# Patient Record
Sex: Female | Born: 1960
Health system: Southern US, Community
[De-identification: ages and names within clinical notes are randomized; demographics above are authoritative.]

## PROBLEM LIST (undated history)

## (undated) DIAGNOSIS — I10 Essential (primary) hypertension: Secondary | ICD-10-CM

## (undated) HISTORY — PX: TUBAL LIGATION: SHX77

## (undated) HISTORY — DX: Essential (primary) hypertension: I10

---

## 1999-03-09 ENCOUNTER — Other Ambulatory Visit: Admission: RE | Admit: 1999-03-09 | Discharge: 1999-03-09 | Payer: Self-pay | Admitting: Obstetrics and Gynecology

## 2000-07-10 ENCOUNTER — Other Ambulatory Visit: Admission: RE | Admit: 2000-07-10 | Discharge: 2000-07-10 | Payer: Self-pay | Admitting: Obstetrics and Gynecology

## 2001-08-05 ENCOUNTER — Other Ambulatory Visit: Admission: RE | Admit: 2001-08-05 | Discharge: 2001-08-05 | Payer: Self-pay | Admitting: Obstetrics and Gynecology

## 2003-02-28 ENCOUNTER — Other Ambulatory Visit: Admission: RE | Admit: 2003-02-28 | Discharge: 2003-02-28 | Payer: Self-pay | Admitting: Obstetrics and Gynecology

## 2003-04-07 ENCOUNTER — Encounter (INDEPENDENT_AMBULATORY_CARE_PROVIDER_SITE_OTHER): Payer: Self-pay | Admitting: *Deleted

## 2003-04-07 ENCOUNTER — Ambulatory Visit (HOSPITAL_COMMUNITY): Admission: RE | Admit: 2003-04-07 | Discharge: 2003-04-07 | Payer: Self-pay | Admitting: Obstetrics and Gynecology

## 2004-03-20 ENCOUNTER — Other Ambulatory Visit: Admission: RE | Admit: 2004-03-20 | Discharge: 2004-03-20 | Payer: Self-pay | Admitting: Obstetrics and Gynecology

## 2009-06-13 ENCOUNTER — Other Ambulatory Visit: Admission: RE | Admit: 2009-06-13 | Discharge: 2009-06-13 | Payer: Self-pay | Admitting: Family Medicine

## 2009-06-16 ENCOUNTER — Ambulatory Visit (HOSPITAL_COMMUNITY): Admission: RE | Admit: 2009-06-16 | Discharge: 2009-06-16 | Payer: Self-pay | Admitting: Family Medicine

## 2009-08-23 ENCOUNTER — Encounter (INDEPENDENT_AMBULATORY_CARE_PROVIDER_SITE_OTHER): Payer: Self-pay | Admitting: Obstetrics and Gynecology

## 2009-08-23 ENCOUNTER — Ambulatory Visit (HOSPITAL_COMMUNITY): Admission: RE | Admit: 2009-08-23 | Discharge: 2009-08-23 | Payer: Self-pay | Admitting: Obstetrics and Gynecology

## 2011-03-29 LAB — CBC
MCHC: 32.5 g/dL (ref 30.0–36.0)
MCV: 80.7 fL (ref 78.0–100.0)
Platelets: 198 10*3/uL (ref 150–400)
WBC: 5.6 10*3/uL (ref 4.0–10.5)

## 2011-03-29 LAB — COMPREHENSIVE METABOLIC PANEL
AST: 17 U/L (ref 0–37)
Albumin: 3.8 g/dL (ref 3.5–5.2)
Calcium: 8.9 mg/dL (ref 8.4–10.5)
Chloride: 103 mEq/L (ref 96–112)
Creatinine, Ser: 0.62 mg/dL (ref 0.4–1.2)
GFR calc Af Amer: >60 mL/min (ref >60)

## 2011-05-07 NOTE — Op Note (Signed)
NAMEDEMETRICE, Deanna Mcfarland               ACCOUNT NO.:  [id number]   MEDICAL RECORD NO.:  [id number]          PATIENT TYPE:  AMB   LOCATION:  SDC                           FACILITY:  [facility]   PHYSICIAN:  Charles A. Delcambre, MDDATE OF BIRTH:  December 14, 1961   DATE OF PROCEDURE:  08/23/2009  DATE OF DISCHARGE:  08/23/2009                               OPERATIVE REPORT   PREOPERATIVE DIAGNOSES:  1. Menorrhagia.  2. Endometrial polyp.   POSTOPERATIVE DIAGNOSES:  1. Menorrhagia.  2. Endometrial polyp.   PROCEDURES:  1. Hysteroscopy, D and C.  2. Polypectomy.  3. Paracervical block.   SURGEON:  Charles A. Delcambre, MD   ASSISTANT:  None.   COMPLICATIONS:  None.   ESTIMATED BLOOD LOSS:  Less than 25 mL.   SPECIMENS:  Endometrial curettings and polypoid tissue to pathology.   FINDINGS:  Difficult to visualize secondary to blood clot in the uterus,  moderate amount of curettings were noted upon curettage, and some  polypoid tissue.  Glycine loss 25 mL.  Instrument, sponge, and needle  count correct x2.   DESCRIPTION OF PROCEDURE:  The patient was taken to the operating room,  placed in supine position.  Monitored anesthesia care with IV sedation  was undertaken.  She was placed in dorsal lithotomy position and sterile  prep and drape was undertaken.  Tenaculum was placed on the cervix.  Sound was to 9.5 cm.  Slight amount of dilation was undertaken.  The 5-  mm scope was placed.  Findings are noted above.  Scope was removed.  Medium  banjo curette after further dilation was done circumferentially yielding  moderate amount of tissue and some polypoid tissue.  Procedure was then  terminated.  Tenaculum site was of good hemostasis.  The patient was  awakened and taken to recovery with physician in attendance.      Charles A. Deanna Cabal, MD  Electronically Signed     CAD/MEDQ  D:  08/23/2009  T:  08/24/2009  Job:  [id number]

## 2011-05-10 NOTE — H&P (Signed)
   NAME:  Deanna Mcfarland, Deanna Mcfarland                         ACCOUNT NO.:  [id number]   MEDICAL RECORD NO.:  [id number]                   PATIENT TYPE:  AMB   LOCATION:  SDC                                  FACILITY:  [facility]   PHYSICIAN:  James A. Ashley Royalty, M.D.             DATE OF BIRTH:  [birth year]   DATE OF ADMISSION:  DATE OF DISCHARGE:                                HISTORY & PHYSICAL   HISTORY:  A 50 year old gravida 3, para 3 who presented to me 02/28/03  complaining of menorrhagia. She had a subsequent sonohysterogram 01/23/03  which revealed a 13 mm echogenic focus in the left aspect of the mid uterine  cavity.  The adnexa were otherwise unremarkable.  The patient presents for  diagnosis/hysteroscopy.   MEDICATIONS:  None.   PAST MEDICAL HISTORY:  Tinea versicolor treated by Rocco Serene, M.D.   PAST SURGICAL HISTORY:  BTSP by Dr. Roberto Scales years ago.   ALLERGIES:  None.   FAMILY HISTORY:  Positive for hypertension.   SOCIAL HISTORY:  The patient denied the use of tobacco or significant  alcohol.   REVIEW OF SYSTEMS:  Noncontributory.   PHYSICAL EXAMINATION:  VITAL SIGNS:  Afebrile, vital signs stable.  GENERAL:  A well-developed, well nourished, pleasant black female in no  acute distress.  SKIN:  Warm and dry without lesions.  LYMPHATICS:  No supraclavicular, cervical or inguinal adenopathy.  HEENT:  Normocephalic.  NECK:  Supple without thyromegaly.  LUNGS:  Clear.  CARDIOVASCULAR:  Regular rate and rhythm, no murmurs, rubs or gallops.  BREASTS:  Exam deferred.  ABDOMEN:  Soft, nontender without masses or organomegaly. Bowel sounds  active.  EXTREMITIES:  Full range of motion without edema, cyanosis or tenderness.  PELVIC:  Deferred to examination under anesthesia.   IMPRESSION:  1. Echogenic focus at sonohysterogram on the left mid-aspect of the uterine     cavity.  2. Menorrhagia, possibly secondary to #1.    PLAN:  Diagnostic/operative hysteroscopy.  The risks and  benefits,  complications and alternatives were fully discussed with the patient.  The  patient stated she understands and accepts.  Questions invited and answered.                                               James A. Ashley Royalty, M.D.    JAM/MEDQ  D:  04/07/2003  T:  04/07/2003  Job:  [id number]

## 2011-05-10 NOTE — Op Note (Signed)
NAME:  Deanna Mcfarland, Deanna Mcfarland                         ACCOUNT NO.:  [id number]   MEDICAL RECORD NO.:  [id number]                   PATIENT TYPE:  AMB   LOCATION:  SDC                                  [facility]ILITY:  [facility]   PHYSICIAN:  James A. Ashley Royalty, M.D.             DATE OF BIRTH:  11/07/1961   DATE OF PROCEDURE:  04/07/2003  DATE OF DISCHARGE:                                 OPERATIVE REPORT   PREOPERATIVE DIAGNOSES:  1. Abnormal uterine bleeding with menorrhagia.  2. Anemia probably secondary to number one.  3. Echogenic focus on the left mid aspect of the uterine cavity at     sonohysterogram.   POSTOPERATIVE DIAGNOSES:  1. Abnormal uterine bleeding with menorrhagia.  2. Anemia probably secondary to number one.  3. Echogenic focus on the left mid aspect of the uterine cavity at     sonohysterogram.  4. Pathology pending.   PROCEDURE:  1. Diagnostic hysteroscopy.  2. Dilatation and curettage.   SURGEON:  Rudy Jew. Ashley Royalty, M.D.   ANESTHESIA:  Monitored anesthesia care with 1% Xylocaine paracervical block  (20 mL).   ESTIMATED BLOOD LOSS:  Less than 25 mL.   COMPLICATIONS:  None.   PACKS AND DRAINS:  None.   PROCEDURE:  The patient was taken to the operating room and placed in the  dorsal supine position.  After adequate IV sedation was administered she was  placed in the lithotomy position and prepped and draped in the usual manner  for vaginal surgery.  Posterior weighted retractor was placed per vagina.  The anterior lip of the cervix was grasped with a single tooth tenaculum.  Approximately 20 mL of 1% Xylocaine was instilled circumferentially around  the cervix to create a good paracervical block.  The uterus was gently  sounded to approximately 8 cm and noted to be anteverted.  The cervix was  then serially dilated to a size 29 [place] using [employer] dilators.  Resectoscope was placed into the uterine cavity.  Sorbitol was used as a  distention medium.  The visualization  appeared difficult.  The pressure was  increased to 90 on the sorbitol pump.  This was considered the maximum.  The  tubal ostia were seen with some difficulty bilaterally.  The alleged focus  on the left mid aspect of the endometrial cavity was not appreciated.  The  anterior and posterior surfaces of the uterine cavity appeared otherwise  unremarkable.  At this point the hysteroscopic part of the procedure was  terminated.   Attention was then turned to the uterine curettage.  A medium sized curette  was introduced into the uterine cavity.  First, a four quadrant curettage  was performed.  Then, a therapeutic curettage was performed.  All curettings  were submitted on Telfa to pathology for histologic studies.  At this point  the patient was felt to have benefitted  maximally from the surgical procedure.  The  vaginal instruments were  removed.  Hemostasis was noted and the procedure was terminated.  The  patient tolerated procedure extremely well and was returned to the recovery  room in good condition.                                               James A. Ashley Royalty, M.D.    JAM/MEDQ  D:  04/07/2003  T:  04/07/2003  Job:  [id number]

## 2011-10-11 ENCOUNTER — Other Ambulatory Visit: Payer: Self-pay | Admitting: Family Medicine

## 2011-10-11 ENCOUNTER — Other Ambulatory Visit (HOSPITAL_COMMUNITY)
Admission: RE | Admit: 2011-10-11 | Discharge: 2011-10-11 | Disposition: A | Discharge status: Home / Self Care | Payer: 59 | Source: Ambulatory Visit | Attending: Family Medicine | Admitting: Family Medicine

## 2011-10-11 DIAGNOSIS — Z0000 Encounter for general adult medical examination without abnormal findings: Secondary | ICD-10-CM | POA: Insufficient documentation

## 2012-11-17 ENCOUNTER — Other Ambulatory Visit (HOSPITAL_COMMUNITY)
Admission: RE | Admit: 2012-11-17 | Discharge: 2012-11-17 | Disposition: A | Discharge status: Home / Self Care | Payer: 59 | Source: Ambulatory Visit | Attending: Obstetrics and Gynecology | Admitting: Obstetrics and Gynecology

## 2012-11-17 ENCOUNTER — Other Ambulatory Visit: Payer: Self-pay | Admitting: Obstetrics and Gynecology

## 2012-11-17 DIAGNOSIS — Z01419 Encounter for gynecological examination (general) (routine) without abnormal findings: Secondary | ICD-10-CM | POA: Insufficient documentation

## 2013-06-29 ENCOUNTER — Emergency Department (HOSPITAL_COMMUNITY)
Admission: EM | Admit: 2013-06-29 | Discharge: 2013-06-29 | Disposition: A | Discharge status: Home / Self Care | Payer: 59 | Attending: Emergency Medicine | Admitting: Emergency Medicine

## 2013-06-29 ENCOUNTER — Encounter (HOSPITAL_COMMUNITY): Payer: Self-pay

## 2013-06-29 DIAGNOSIS — R059 Cough, unspecified: Secondary | ICD-10-CM | POA: Insufficient documentation

## 2013-06-29 DIAGNOSIS — R05 Cough: Secondary | ICD-10-CM | POA: Insufficient documentation

## 2013-06-29 DIAGNOSIS — R002 Palpitations: Secondary | ICD-10-CM | POA: Insufficient documentation

## 2013-06-29 DIAGNOSIS — R0602 Shortness of breath: Secondary | ICD-10-CM | POA: Insufficient documentation

## 2013-06-29 LAB — COMPREHENSIVE METABOLIC PANEL WITH GFR
ALT: 8 U/L (ref 0–35)
AST: 23 U/L (ref 0–37)
Albumin: 3.4 g/dL — ABNORMAL LOW (ref 3.5–5.2)
Alkaline Phosphatase: 83 U/L (ref 39–117)
BUN: 9 mg/dL (ref 6–23)
CO2: 30 meq/L (ref 19–32)
Calcium: 9 mg/dL (ref 8.4–10.5)
Chloride: 100 meq/L (ref 96–112)
Creatinine, Ser: 0.7 mg/dL (ref 0.50–1.10)
GFR calc Af Amer: >90 mL/min
GFR calc non Af Amer: >90 mL/min
Glucose, Bld: 109 mg/dL — ABNORMAL HIGH (ref 70–99)
Potassium: 3.6 meq/L (ref 3.5–5.1)
Sodium: 135 meq/L (ref 135–145)
Total Bilirubin: 0.3 mg/dL (ref 0.3–1.2)
Total Protein: 7.7 g/dL (ref 6.0–8.3)

## 2013-06-29 LAB — T4, FREE: Free T4: 1.03 ng/dL (ref 0.80–1.80)

## 2013-06-29 LAB — TSH: TSH: 1.48 u[IU]/mL (ref 0.350–4.500)

## 2013-06-29 LAB — CBC
HCT: 40.2 % (ref 36.0–46.0)
Hemoglobin: 13 g/dL (ref 12.0–15.0)
MCH: 26.3 pg (ref 26.0–34.0)
MCV: 81.2 fL (ref 78.0–100.0)
RBC: 4.95 MIL/uL (ref 3.87–5.11)

## 2013-06-29 MED ORDER — METOPROLOL SUCCINATE ER 25 MG PO TB24: 25.0000 mg | ORAL_TABLET | Freq: Every day | ORAL | Status: DC

## 2013-06-29 NOTE — ED Notes (Signed)
Patient reports that she had coffee 2 days ago by accident and since then has felt like her heart was fluttering and feeling anxious. Patient states she was taken off coffee because it was causing her heart to flutter. Patient denies SOB.

## 2013-06-29 NOTE — ED Provider Notes (Signed)
History    CSN: [id number] Arrival date & time 06/29/13  4401  First MD Initiated Contact with Patient 06/29/13 0725     Chief Complaint  Patient presents with  . Anxiety    HPI Patient presents with a sense of palpitations over the past several days.  She had palpitations about 10 years ago and caffeine now.  She had cough for 2 days ago now is intermittently had palpitations with associated shortness of breath since then.  No syncope or near-syncope.  No chest pain or chest tightness.  She did not check her heart rate at home.  She has no prior history of cardiac disease.  No history of drug abuse.  She does not smoke cigarettes.  No history of high blood pressure, high cholesterol, or diabetes.  Symptoms are mild to moderate when they occur.  Patient feels much better at this time and has no sense of palpitations at this time.  She has a primary care physician but has not spoken with him about this.  She does not have a cardiologist.  She's never worn a Holter monitor   History reviewed. No pertinent past medical history. Past Surgical History  Procedure Laterality Date  . Tubal ligation     Family History  Problem Relation Age of Onset  . Hypertension Mother   . Thyroid disease Mother   . Hypertension Father    History  Substance Use Topics  . Smoking status: Never Smoker   . Smokeless tobacco: Never Used  . Alcohol Use: No   OB History   Grav Para Term Preterm Abortions TAB SAB Ect Mult Living                 Review of Systems  All other systems reviewed and are negative.    Allergies  Review of patient's allergies indicates no known allergies.  Home Medications  No current outpatient prescriptions on file. BP 143/86  Pulse 73  Temp(Src) 98 F (36.7 C) (Oral)  Resp 16  SpO2 99% Physical Exam  Nursing note and vitals reviewed. Constitutional: She is oriented to person, place, and time. She appears well-developed and well-nourished. No distress.  HENT:   Head: Normocephalic and atraumatic.  Eyes: EOM are normal.  Neck: Normal range of motion.  Cardiovascular: Normal rate, regular rhythm and normal heart sounds.   Pulmonary/Chest: Effort normal and breath sounds normal.  Abdominal: Soft. She exhibits no distension. There is no tenderness.  Musculoskeletal: Normal range of motion.  Neurological: She is alert and oriented to person, place, and time.  Skin: Skin is warm and dry.  Psychiatric: She has a normal mood and affect. Judgment normal.    ED Course  Procedures (including critical care time)   Date: 06/29/2013  Rate: 73  Rhythm: normal sinus rhythm  QRS Axis: normal  Intervals: normal  ST/T Wave abnormalities: normal  Conduction Disutrbances: none  Narrative Interpretation: no ectopy noted  Old EKG Reviewed: no prior ecg     Labs Reviewed  COMPREHENSIVE METABOLIC PANEL - Abnormal; Notable for the following:    Glucose, Bld 109 (*)    Albumin 3.4 (*)    All other components within normal limits  CBC  TSH  T4, FREE   No results found. 1. Palpitations     MDM  Patient presents with what sounds like palpitations.  This may represent atrial fib/flutter or other tachyarrhythmias.  Normal sinus rhythm at this time.  Will check electrolytes as well as obtain thyroid  studies for followup purposes.  The patient will likely be able to be discharged home with outpatient cardiology followup and outpatient Holter monitoring.  The patient be informed to not use any stimulants and I will place her on low-dose beta blocker.  Lyanne Co, MD 06/29/13 [phone]

## 2013-06-29 NOTE — ED Notes (Addendum)
Patient c/o feeling anxious and feeling like her heart is fluttering since drinking coffeee 2 days ago. Patient states she ws told not to drink coffee due to previous heart fluttering when she drinks coffee.

## 2013-07-01 ENCOUNTER — Ambulatory Visit (INDEPENDENT_AMBULATORY_CARE_PROVIDER_SITE_OTHER): Payer: 59 | Admitting: Internal Medicine

## 2013-07-01 ENCOUNTER — Encounter: Payer: Self-pay | Admitting: Internal Medicine

## 2013-07-01 VITALS — BP 128/84 | HR 68 | Ht 67.0 in | Wt 224.3 lb

## 2013-07-01 DIAGNOSIS — R002 Palpitations: Secondary | ICD-10-CM | POA: Insufficient documentation

## 2013-07-01 DIAGNOSIS — J309 Allergic rhinitis, unspecified: Secondary | ICD-10-CM

## 2013-07-01 DIAGNOSIS — J302 Other seasonal allergic rhinitis: Secondary | ICD-10-CM

## 2013-07-01 NOTE — Progress Notes (Signed)
OFFICE NOTE  Chief Complaint:  Palpitations  Primary Care Physician: Cala Bradford, MD  HPI:  Deanna Mcfarland is a pleasant 52 year old female with relatively little past medical history. She does have seasonal allergies and recently has been taking Allegra-D.  She was seen in the emergency department her palpitations. She has a history of this in the past and it seemed to have been associated with caffeine. She cut caffeine out of her diet and they went away completely. Recently they have returned and she thought it was related to a couple of foods that she ate the other day. She reportedly had ice cream which contained coffee and a mixed drink which also had coffee beans.  She describes the palpitations coming more frequently at rest or at night.  They're not more prevalent with exertion, in fact she is able to exercise almost every day without any problems. She denies any chest pain or worsening shortness of breath with exertion and is able to walk up hills, jog and perform other strenuous exercise without limitation. She underwent blood work in the emergency room which was normal including electrolytes. Reviewed her EKG from the ER as well as her EKG today and there are no evidence for extrasystoles and actually no EKG abnormalities at all.  In the emergency department she was started on metoprolol which she's been taking for 2 days. She is not sure whether there has been a decrease in her palpitations or not, but she is tolerating the medication well.   PMHx:  History reviewed. No pertinent past medical history.  Past Surgical History  Procedure Laterality Date  . Tubal ligation      FAMHx:  Family History  Problem Relation Age of Onset  . Hypertension Mother   . Thyroid disease Mother   . Hypertension Father     SOCHx:   reports that she has never smoked. She has never used smokeless tobacco. She reports that  drinks alcohol. She reports that she does not use illicit  drugs.  ALLERGIES:  No Known Allergies Seasonal allergies  ROS: A comprehensive review of systems was negative except for: Cardiovascular: positive for palpitations  HOME MEDS: Current Outpatient Prescriptions  Medication Sig Dispense Refill  . Fexofenadine HCl (ALLEGRA PO) Take 1 tablet by mouth daily as needed.      . metoprolol succinate (TOPROL-XL) 25 MG 24 hr tablet Take 1 tablet (25 mg total) by mouth daily.  30 tablet  0   No current facility-administered medications for this visit.    LABS/IMAGING: No results found for this or any previous visit (from the past 48 hour(s)). No results found.  VITALS: BP 128/84  Pulse 68  Ht 5\' 7"  (1.702 m)  Wt 224 lb 4.8 oz (101.742 kg)  BMI 35.12 kg/m2  EXAM: General appearance: alert and no distress Neck: no adenopathy, no carotid bruit, no JVD, supple, symmetrical, trachea midline and thyroid not enlarged, symmetric, no tenderness/mass/nodules Lungs: clear to auscultation bilaterally Heart: regular rate and rhythm, S1, S2 normal, no murmur, click, rub or gallop Abdomen: soft, non-tender; bowel sounds normal; no masses,  no organomegaly Extremities: extremities normal, atraumatic, no cyanosis or edema Pulses: 2+ and symmetric Skin: Skin color, texture, turgor normal. No rashes or lesions Neurologic: Grossly normal  EKG: Normal sinus rhythm at 68  ASSESSMENT: 1. Palpitations  PLAN: 1.    [name] is having subjective palpitations, which may have been mildly improved with metoprolol. She does not have any alarm symptoms and her EKG  at rest is normal. She does identify triggers for her palpitations which have been present on and off for a good part of her adult life. One of which is caffeine which he has recently been exposed to. The other, which she was not aware of, would be the pseudoephedrine and her Allegra. She reports that she has not taken this in the past few weeks however. I counseled her on avoiding Allegra-D, or  other products containing pseudoephedrine. Again, I think these palpitations are benign without any high-risk features. It is very reasonable to continue metoprolol or if she wishes to try to wean herself off of that to see if her palpitations go away. If her symptoms return or worsen, have asked her to contact me and we will arrange for Holter monitoring. We'll arrange for followup as needed.  Chrystie Nose, MD, [facility] Attending Cardiologist [facility]  HILTY,Kenneth C 07/01/2013, 9:52 AM

## 2013-07-01 NOTE — Patient Instructions (Addendum)
Your physician recommends that you schedule a follow-up appointment as needed.   If you have worsening heart palpitations, do not hesitate to call our office. [phone]

## 2013-11-26 ENCOUNTER — Other Ambulatory Visit (HOSPITAL_COMMUNITY)
Admission: RE | Admit: 2013-11-26 | Discharge: 2013-11-26 | Disposition: A | Discharge status: Home / Self Care | Payer: 59 | Source: Ambulatory Visit | Attending: Obstetrics and Gynecology | Admitting: Obstetrics and Gynecology

## 2013-11-26 ENCOUNTER — Other Ambulatory Visit: Payer: Self-pay | Admitting: Obstetrics and Gynecology

## 2013-11-26 DIAGNOSIS — Z01419 Encounter for gynecological examination (general) (routine) without abnormal findings: Secondary | ICD-10-CM | POA: Insufficient documentation

## 2013-11-26 DIAGNOSIS — Z1151 Encounter for screening for human papillomavirus (HPV): Secondary | ICD-10-CM | POA: Insufficient documentation

## 2014-05-17 ENCOUNTER — Encounter: Payer: Self-pay | Admitting: Internal Medicine

## 2014-12-14 ENCOUNTER — Other Ambulatory Visit (HOSPITAL_COMMUNITY)
Admission: RE | Admit: 2014-12-14 | Discharge: 2014-12-14 | Disposition: A | Discharge status: Home / Self Care | Payer: 59 | Source: Ambulatory Visit | Attending: Obstetrics and Gynecology | Admitting: Obstetrics and Gynecology

## 2014-12-14 ENCOUNTER — Other Ambulatory Visit: Payer: Self-pay | Admitting: Obstetrics and Gynecology

## 2014-12-14 DIAGNOSIS — Z01419 Encounter for gynecological examination (general) (routine) without abnormal findings: Secondary | ICD-10-CM | POA: Insufficient documentation

## 2014-12-15 LAB — CYTOLOGY - PAP

## 2015-07-09 ENCOUNTER — Emergency Department (HOSPITAL_COMMUNITY)
Admission: EM | Admit: 2015-07-09 | Discharge: 2015-07-10 | Disposition: A | Discharge status: Home / Self Care | Payer: 59 | Attending: Emergency Medicine | Admitting: Emergency Medicine

## 2015-07-09 ENCOUNTER — Encounter (HOSPITAL_COMMUNITY): Payer: Self-pay | Admitting: Emergency Medicine

## 2015-07-09 ENCOUNTER — Emergency Department (HOSPITAL_COMMUNITY): Payer: 59

## 2015-07-09 DIAGNOSIS — I10 Essential (primary) hypertension: Secondary | ICD-10-CM | POA: Insufficient documentation

## 2015-07-09 DIAGNOSIS — R0602 Shortness of breath: Secondary | ICD-10-CM | POA: Insufficient documentation

## 2015-07-09 DIAGNOSIS — Z79899 Other long term (current) drug therapy: Secondary | ICD-10-CM | POA: Insufficient documentation

## 2015-07-09 DIAGNOSIS — R42 Dizziness and giddiness: Secondary | ICD-10-CM

## 2015-07-09 LAB — CBG MONITORING, ED: Glucose-Capillary: 99 mg/dL (ref 65–99)

## 2015-07-09 MED ORDER — SODIUM CHLORIDE 0.9 % IV BOLUS (SEPSIS)
1000.0000 mL | Freq: Once | INTRAVENOUS | Status: AC
  Administered 2015-07-09: 1000 mL via INTRAVENOUS

## 2015-07-09 NOTE — ED Provider Notes (Signed)
CSN: [id number]     Arrival date & time 07/09/15  2253 History   First MD Initiated Contact with Patient 07/09/15 2312     Chief Complaint  Patient presents with  . Dizziness  . Shortness of Breath     (Consider location/radiation/quality/duration/timing/severity/associated sxs/prior Treatment) HPI Comments: Patient is a 54 year old female with history of hypertension and seasonal allergies. She presents today with complaints of dizziness. She was driving home from [place] she stated she felt lightheaded and as if she was going to pass out. She had spent the day there sightseeing and had gone to a show. She reports it was hot outside but does report she drank a significant amount of water. She denies any chest pain, palpitations, headache. She does report feeling short of breath.  Patient is a 54 y.o. female presenting with dizziness. The history is provided by the patient.  Dizziness Quality:  Lightheadedness Severity:  Moderate Onset quality:  Sudden Duration:  4 hours Timing:  Intermittent Progression:  Partially resolved Chronicity:  New Relieved by:  Nothing Worsened by:  Nothing Ineffective treatments:  None tried Associated symptoms: no blood in stool, no nausea and no palpitations     History reviewed. No pertinent past medical history. Past Surgical History  Procedure Laterality Date  . Tubal ligation     Family History  Problem Relation Age of Onset  . Hypertension Mother   . Thyroid disease Mother   . Hypertension Father    History  Substance Use Topics  . Smoking status: Never Smoker   . Smokeless tobacco: Never Used  . Alcohol Use: Yes     Comment: occasional wine   OB History    No data available     Review of Systems  Cardiovascular: Negative for palpitations.  Gastrointestinal: Negative for nausea and blood in stool.  Neurological: Positive for dizziness.  All other systems reviewed and are negative.     Allergies  Review of patient's  allergies indicates no known allergies.  Home Medications   Prior to Admission medications   Medication Sig Start Date End Date Taking? Authorizing Provider  Fexofenadine HCl (ALLEGRA PO) Take 1 tablet by mouth daily as needed.    Historical Provider, MD  metoprolol succinate (TOPROL-XL) 25 MG 24 hr tablet Take 1 tablet (25 mg total) by mouth daily. 06/29/13   Azalia Bilis, MD   BP 158/95 mmHg  Pulse 77  Temp(Src) 97.7 F (36.5 C) (Oral)  Resp 18  SpO2 100% Physical Exam  Constitutional: She is oriented to person, place, and time. She appears well-developed and well-nourished. No distress.  HENT:  Head: Normocephalic and atraumatic.  Mouth/Throat: Oropharynx is clear and moist.  Eyes: EOM are normal. Pupils are equal, round, and reactive to light.  Neck: Normal range of motion. Neck supple.  Cardiovascular: Normal rate and regular rhythm.  Exam reveals no gallop and no friction rub.   No murmur heard. Pulmonary/Chest: Effort normal and breath sounds normal. No respiratory distress. She has no wheezes.  Abdominal: Soft. Bowel sounds are normal. She exhibits no distension. There is no tenderness.  Musculoskeletal: Normal range of motion.  Lymphadenopathy:    She has no cervical adenopathy.  Neurological: She is alert and oriented to person, place, and time. No cranial nerve deficit. She exhibits normal muscle tone. Coordination normal.  Skin: Skin is warm and dry. She is not diaphoretic.  Nursing note and vitals reviewed.   ED Course  Procedures (including critical care time) Labs Review Labs  Reviewed  BASIC METABOLIC PANEL  CBC  URINALYSIS, ROUTINE W REFLEX MICROSCOPIC (NOT AT [facility])  CBG MONITORING, ED    Imaging Review No results found.   EKG Interpretation   Date/Time:  Sunday July 09 2015 23:11:30 EDT Ventricular Rate:  74 PR Interval:  170 QRS Duration: 94 QT Interval:  389 QTC Calculation: 432 R Axis:   40 Text Interpretation:  Sinus rhythm Abnormal R-wave  progression, early  transition Confirmed by Abygayle Deltoro  MD, Atiyah Bauer ([id number]) on 07/09/2015 11:42:28 PM      MDM   Final diagnoses:  None    Patient presents with complaints of dizziness. Her vital signs and physical examination are normal and laboratory studies reveal no acute abnormality. Her EKG is unremarkable and I see no obvious cause of her dizziness. Nothing here appears acute and I believe she is appropriate for discharge.    Geoffery Lyonsouglas Berl Bonfanti, MD 07/10/15 [id number]

## 2015-07-09 NOTE — ED Notes (Signed)
Pt states she was driving home yesterday and became dizzy and sob. Alert and oriented.

## 2015-07-10 LAB — CBC
HEMATOCRIT: 39.7 % (ref 36.0–46.0)
Hemoglobin: 12.9 g/dL (ref 12.0–15.0)
MCH: 26.6 pg (ref 26.0–34.0)
MCHC: 32.5 g/dL (ref 30.0–36.0)
MCV: 81.9 fL (ref 78.0–100.0)
Platelets: 213 10*3/uL (ref 150–400)
RBC: 4.85 MIL/uL (ref 3.87–5.11)
RDW: 14.6 % (ref 11.5–15.5)
WBC: 6.8 10*3/uL (ref 4.0–10.5)

## 2015-07-10 LAB — URINALYSIS, ROUTINE W REFLEX MICROSCOPIC
Bilirubin Urine: NEGATIVE
GLUCOSE, UA: NEGATIVE mg/dL
HGB URINE DIPSTICK: NEGATIVE
KETONES UR: NEGATIVE mg/dL
Leukocytes, UA: NEGATIVE
NITRITE: NEGATIVE
PROTEIN: NEGATIVE mg/dL
Specific Gravity, Urine: 1.015 (ref 1.005–1.030)
Urobilinogen, UA: 0.2 mg/dL (ref 0.0–1.0)
pH: 6 (ref 5.0–8.0)

## 2015-07-10 LAB — BASIC METABOLIC PANEL
Anion gap: 7 (ref 5–15)
BUN: 12 mg/dL (ref 6–20)
CHLORIDE: 102 mmol/L (ref 101–111)
CO2: 28 mmol/L (ref 22–32)
CREATININE: 0.7 mg/dL (ref 0.44–1.00)
Calcium: 8.8 mg/dL — ABNORMAL LOW (ref 8.9–10.3)
GFR calc non Af Amer: >60 mL/min (ref >60)
Glucose, Bld: 121 mg/dL — ABNORMAL HIGH (ref 65–99)
Potassium: 3.6 mmol/L (ref 3.5–5.1)
SODIUM: 137 mmol/L (ref 135–145)

## 2015-07-10 NOTE — Discharge Instructions (Signed)
Drink plenty of fluids and get plenty of rest.  Follow-up with your primary Dr. if not improving in the next few days, and return to the ER if symptoms significantly worsen or change.   Dizziness Dizziness is a common problem. It is a feeling of unsteadiness or light-headedness. You may feel like you are about to faint. Dizziness can lead to injury if you stumble or fall. A person of any age group can suffer from dizziness, but dizziness is more common in older adults. CAUSES  Dizziness can be caused by many different things, including:  Middle ear problems.  Standing for too long.  Infections.  An allergic reaction.  Aging.  An emotional response to something, such as the sight of blood.  Side effects of medicines.  Tiredness.  Problems with circulation or blood pressure.  Excessive use of alcohol or medicines, or illegal drug use.  Breathing too fast (hyperventilation).  An irregular heart rhythm (arrhythmia).  A low red blood cell count (anemia).  Pregnancy.  Vomiting, diarrhea, fever, or other illnesses that cause body fluid loss (dehydration).  Diseases or conditions such as Parkinson's disease, high blood pressure (hypertension), diabetes, and thyroid problems.  Exposure to extreme heat. DIAGNOSIS  Your health care provider will ask about your symptoms, perform a physical exam, and perform an electrocardiogram (ECG) to record the electrical activity of your heart. Your health care provider may also perform other heart or blood tests to determine the cause of your dizziness. These may include:  Transthoracic echocardiogram (TTE). During echocardiography, sound waves are used to evaluate how blood flows through your heart.  Transesophageal echocardiogram (TEE).  Cardiac monitoring. This allows your health care provider to monitor your heart rate and rhythm in real time.  Holter monitor. This is a portable device that records your heartbeat and can help diagnose  heart arrhythmias. It allows your health care provider to track your heart activity for several days if needed.  Stress tests by exercise or by giving medicine that makes the heart beat faster. TREATMENT  Treatment of dizziness depends on the cause of your symptoms and can vary greatly. HOME CARE INSTRUCTIONS   Drink enough fluids to keep your urine clear or pale yellow. This is especially important in very hot weather. In older adults, it is also important in cold weather.  Take your medicine exactly as directed if your dizziness is caused by medicines. When taking blood pressure medicines, it is especially important to get up slowly.  Rise slowly from chairs and steady yourself until you feel okay.  In the morning, first sit up on the side of the bed. When you feel okay, stand slowly while holding onto something until you know your balance is fine.  Move your legs often if you need to stand in one place for a long time. Tighten and relax your muscles in your legs while standing.  Have someone stay with you for 1-2 days if dizziness continues to be a problem. Do this until you feel you are well enough to stay alone. Have the person call your health care provider if he or she notices changes in you that are concerning.  Do not drive or use heavy machinery if you feel dizzy.  Do not drink alcohol. SEEK IMMEDIATE MEDICAL CARE IF:   Your dizziness or light-headedness gets worse.  You feel nauseous or vomit.  You have problems talking, walking, or using your arms, hands, or legs.  You feel weak.  You are not thinking clearly  or you have trouble forming sentences. It may take a friend or family member to notice this.  You have chest pain, abdominal pain, shortness of breath, or sweating.  Your vision changes.  You notice any bleeding.  You have side effects from medicine that seems to be getting worse rather than better. MAKE SURE YOU:   Understand these instructions.  Will  watch your condition.  Will get help right away if you are not doing well or get worse. Document Released: 06/04/2001 Document Revised: 12/14/2013 Document Reviewed: 06/28/2011 [facility] Patient Information 2015 [place], [place]. This information is not intended to replace advice given to you by your health care provider. Make sure you discuss any questions you have with your health care provider.

## 2015-07-16 ENCOUNTER — Emergency Department (HOSPITAL_COMMUNITY): Payer: 59

## 2015-07-16 ENCOUNTER — Encounter (HOSPITAL_COMMUNITY): Payer: Self-pay

## 2015-07-16 ENCOUNTER — Emergency Department (HOSPITAL_COMMUNITY)
Admission: EM | Admit: 2015-07-16 | Discharge: 2015-07-16 | Disposition: A | Discharge status: Home / Self Care | Payer: 59 | Attending: Emergency Medicine | Admitting: Emergency Medicine

## 2015-07-16 DIAGNOSIS — J309 Allergic rhinitis, unspecified: Secondary | ICD-10-CM | POA: Insufficient documentation

## 2015-07-16 DIAGNOSIS — Z7951 Long term (current) use of inhaled steroids: Secondary | ICD-10-CM | POA: Insufficient documentation

## 2015-07-16 DIAGNOSIS — R42 Dizziness and giddiness: Secondary | ICD-10-CM

## 2015-07-16 DIAGNOSIS — Z79899 Other long term (current) drug therapy: Secondary | ICD-10-CM | POA: Diagnosis not present

## 2015-07-16 DIAGNOSIS — J302 Other seasonal allergic rhinitis: Secondary | ICD-10-CM

## 2015-07-16 LAB — I-STAT CHEM 8, ED
BUN: 11 mg/dL (ref 6–20)
Calcium, Ion: 1.15 mmol/L (ref 1.12–1.23)
Chloride: 100 mmol/L — ABNORMAL LOW (ref 101–111)
Creatinine, Ser: 0.7 mg/dL (ref 0.44–1.00)
Glucose, Bld: 101 mg/dL — ABNORMAL HIGH (ref 65–99)
HEMATOCRIT: 46 % (ref 36.0–46.0)
Hemoglobin: 15.6 g/dL — ABNORMAL HIGH (ref 12.0–15.0)
Potassium: 3.6 mmol/L (ref 3.5–5.1)
SODIUM: 140 mmol/L (ref 135–145)
TCO2: 26 mmol/L (ref 0–100)

## 2015-07-16 LAB — I-STAT TROPONIN, ED: Troponin i, poc: 0 ng/mL (ref 0.00–0.08)

## 2015-07-16 MED ORDER — DEXAMETHASONE 4 MG PO TABS
10.0000 mg | ORAL_TABLET | Freq: Once | ORAL | Status: AC
  Administered 2015-07-16: 10 mg via ORAL
  Filled 2015-07-16: qty 3

## 2015-07-16 MED ORDER — METHYLPREDNISOLONE 4 MG PO TBPK: ORAL_TABLET | ORAL | Status: DC

## 2015-07-16 NOTE — ED Provider Notes (Signed)
CSN: [id number]     Arrival date & time 07/16/15  1537 History   First MD Initiated Contact with Patient 07/16/15 [phone]     Chief Complaint  Patient presents with  . Dizziness     Patient is a 54 y.o. female presenting with dizziness. The history is provided by the patient. No language interpreter was used.  Dizziness  Ms. Deanna Mcfarland presents for evaluation of dizziness. She's had 1 week of intermittent dizziness. Episodes last several seconds this time and they're most commonly occurring while she is driving. Dizziness is described as room spinning and off balance type sensation. Episodes also occur when she gets hot.  She has a significant amount of nasal congestion and postnasal drip with itchy eyes, sneezing.  She is taking three allergy medications.  Today she had an episode in the car.  Sxs are moderate, intermittent, worsening.  She denies any medical hx.  No chest pain, cough, numbness, weakness, headache, leg swelling.    History reviewed. No pertinent past medical history. Past Surgical History  Procedure Laterality Date  . Tubal ligation     Family History  Problem Relation Age of Onset  . Hypertension Mother   . Thyroid disease Mother   . Hypertension Father    History  Substance Use Topics  . Smoking status: Never Smoker   . Smokeless tobacco: Never Used  . Alcohol Use: Yes     Comment: occasional wine   OB History    No data available     Review of Systems  Neurological: Positive for dizziness.  All other systems reviewed and are negative.     Allergies  Review of patient's allergies indicates no known allergies.  Home Medications   Prior to Admission medications   Medication Sig Start Date End Date Taking? Authorizing Provider  azelastine (ASTELIN) 0.1 % nasal spray Place 1 spray into both nostrils 2 (two) times daily. Use in each nostril as directed    Historical Provider, MD  fexofenadine (ALLEGRA) 180 MG tablet Take 180 mg by mouth daily as needed for  allergies or rhinitis.    Historical Provider, MD  fluticasone (FLONASE) 50 MCG/ACT nasal spray Place 1 spray into both nostrils daily.    Historical Provider, MD  montelukast (SINGULAIR) 10 MG tablet Take 10 mg by mouth at bedtime.    Historical Provider, MD  oxymetazoline (AFRIN) 0.05 % nasal spray Place 1 spray into both nostrils 2 (two) times daily as needed for congestion.    Historical Provider, MD   BP 155/88 mmHg  Pulse 94  Temp(Src) 99.1 F (37.3 C) (Oral)  Resp 22  SpO2 99% Physical Exam  Constitutional: She is oriented to person, place, and time. She appears well-developed and well-nourished.  HENT:  Head: Normocephalic and atraumatic.  Mouth/Throat: Oropharynx is clear and moist. No oropharyngeal exudate.  TMs with clear effusions bilaterally. Boggy nasal mucosa. No maxillary or frontal sinus tenderness to palpation  Eyes: EOM are normal. Pupils are equal, round, and reactive to light.  No nystagmus  Cardiovascular: Normal rate and regular rhythm.   No murmur heard. Pulmonary/Chest: Effort normal and breath sounds normal. No respiratory distress.  Abdominal: Soft. There is no tenderness. There is no rebound and no guarding.  Musculoskeletal: She exhibits no edema or tenderness.  Neurological: She is alert and oriented to person, place, and time. No cranial nerve deficit. Coordination normal.  Skin: Skin is warm and dry.  Psychiatric: She has a normal mood and affect. Her behavior is  normal.  Nursing note and vitals reviewed.   ED Course  Procedures (including critical care time) Labs Review Labs Reviewed  I-STAT CHEM 8, ED - Abnormal; Notable for the following:    Chloride 100 (*)    Glucose, Bld 101 (*)    Hemoglobin 15.6 (*)    All other components within normal limits  I-STAT TROPOININ, ED    Imaging Review Ct Head Wo Contrast  07/16/2015   CLINICAL DATA:  Intermittent dizziness for 1 week, diagnosed with fluid in her years by primary care physician  EXAM:  CT HEAD WITHOUT CONTRAST  TECHNIQUE: Contiguous axial images were obtained from the base of the skull through the vertex without intravenous contrast.  COMPARISON:  None.  FINDINGS: Calvarium intact. Visualized portions of the paranasal sinuses and mastoid air cells clear.  Normal sulcation and attenuation with no hemorrhage infarct mass extra-axial fluid or hydrocephalus.  IMPRESSION: Negative head CT   Electronically Signed   By: Esperanza Heir M.D.   On: 07/16/2015 17:48     EKG Interpretation   Date/Time:  Sunday July 16 2015 15:41:57 EDT Ventricular Rate:  90 PR Interval:  170 QRS Duration: 90 QT Interval:  368 QTC Calculation: 450 R Axis:   35 Text Interpretation:  Normal sinus rhythm Normal ECG No significant change  since last tracing Confirmed by Ethelda Chick  MD, SAM [phone]) on 07/16/2015  3:47:09 PM      MDM   Final diagnoses:  Vertigo  Seasonal allergies    Pt here for evaluation of episodic vertigo, congestion.  Hx and presentation is not c/w CVA, ACS, PE, acute bacterial process.  Pt with likely severe seasonal allergies - treating with steroids, outpatient follow up.  Discussed outpatient follow up, return precautions.     Tilden Fossa, MD 07/17/15 [phone]

## 2015-07-16 NOTE — Discharge Instructions (Signed)
Allergies °Allergies may happen from anything your body is sensitive to. This may be food, medicines, pollens, chemicals, and nearly anything around you in everyday life that produces allergens. An allergen is anything that causes an allergy producing substance. Heredity is often a factor in causing these problems. This means you may have some of the same allergies as your parents. °Food allergies happen in all age groups. Food allergies are some of the most severe and life threatening. Some common food allergies are cow's milk, seafood, eggs, nuts, wheat, and soybeans. °SYMPTOMS  °· Swelling around the mouth. °· An itchy red rash or hives. °· Vomiting or diarrhea. °· Difficulty breathing. °SEVERE ALLERGIC REACTIONS ARE LIFE-THREATENING. °This reaction is called anaphylaxis. It can cause the mouth and throat to swell and cause difficulty with breathing and swallowing. In severe reactions only a trace amount of food (for example, peanut oil in a salad) may cause death within seconds. °Seasonal allergies occur in all age groups. These are seasonal because they usually occur during the same season every year. They may be a reaction to molds, grass pollens, or tree pollens. Other causes of problems are house dust mite allergens, pet dander, and mold spores. The symptoms often consist of nasal congestion, a runny itchy nose associated with sneezing, and tearing itchy eyes. There is often an associated itching of the mouth and ears. The problems happen when you come in contact with pollens and other allergens. Allergens are the particles in the air that the body reacts to with an allergic reaction. This causes you to release allergic antibodies. Through a chain of events, these eventually cause you to release histamine into the blood stream. Although it is meant to be protective to the body, it is this release that causes your discomfort. This is why you were given anti-histamines to feel better.  If you are unable to  pinpoint the offending allergen, it may be determined by skin or blood testing. Allergies cannot be cured but can be controlled with medicine. °Hay fever is a collection of all or some of the seasonal allergy problems. It may often be treated with simple over-the-counter medicine such as diphenhydramine. Take medicine as directed. Do not drink alcohol or drive while taking this medicine. Check with your caregiver or package insert for child dosages. °If these medicines are not effective, there are many new medicines your caregiver can prescribe. Stronger medicine such as nasal spray, eye drops, and corticosteroids may be used if the first things you try do not work well. Other treatments such as immunotherapy or desensitizing injections can be used if all else fails. Follow up with your caregiver if problems continue. These seasonal allergies are usually not life threatening. They are generally more of a nuisance that can often be handled using medicine. °HOME CARE INSTRUCTIONS  °· If unsure what causes a reaction, keep a diary of foods eaten and symptoms that follow. Avoid foods that cause reactions. °· If hives or rash are present: °· Take medicine as directed. °· You may use an over-the-counter antihistamine (diphenhydramine) for hives and itching as needed. °· Apply cold compresses (cloths) to the skin or take baths in cool water. Avoid hot baths or showers. Heat will make a rash and itching worse. °· If you are severely allergic: °· Following a treatment for a severe reaction, hospitalization is often required for closer follow-up. °· Wear a medic-alert bracelet or necklace stating the allergy. °· You and your family must learn how to give adrenaline or use   an anaphylaxis kit. °· If you have had a severe reaction, always carry your anaphylaxis kit or EpiPen® with you. Use this medicine as directed by your caregiver if a severe reaction is occurring. Failure to do so could have a fatal outcome. °SEEK MEDICAL  CARE IF: °· You suspect a food allergy. Symptoms generally happen within 30 minutes of eating a food. °· Your symptoms have not gone away within 2 days or are getting worse. °· You develop new symptoms. °· You want to retest yourself or your child with a food or drink you think causes an allergic reaction. Never do this if an anaphylactic reaction to that food or drink has happened before. Only do this under the care of a caregiver. °SEEK IMMEDIATE MEDICAL CARE IF:  °· You have difficulty breathing, are wheezing, or have a tight feeling in your chest or throat. °· You have a swollen mouth, or you have hives, swelling, or itching all over your body. °· You have had a severe reaction that has responded to your anaphylaxis kit or an EpiPen®. These reactions may return when the medicine has worn off. These reactions should be considered life threatening. °MAKE SURE YOU:  °· Understand these instructions. °· Will watch your condition. °· Will get help right away if you are not doing well or get worse. °Document Released: 03/04/2003 Document Revised: 04/05/2013 Document Reviewed: 08/08/2008 °[employer]® Patient Information ©2015 [employer]. This information is not intended to replace advice given to you by your health care provider. Make sure you discuss any questions you have with your health care provider. °Dizziness °Dizziness is a common problem. It is a feeling of unsteadiness or light-headedness. You may feel like you are about to faint. Dizziness can lead to injury if you stumble or fall. A person of any age group can suffer from dizziness, but dizziness is more common in older adults. °CAUSES  °Dizziness can be caused by many different things, including: °· Middle ear problems. °· Standing for too long. °· Infections. °· An allergic reaction. °· Aging. °· An emotional response to something, such as the sight of blood. °· Side effects of medicines. °· Tiredness. °· Problems with circulation or blood  pressure. °· Excessive use of alcohol or medicines, or illegal drug use. °· Breathing too fast (hyperventilation). °· An irregular heart rhythm (arrhythmia). °· A low red blood cell count (anemia). °· Pregnancy. °· Vomiting, diarrhea, fever, or other illnesses that cause body fluid loss (dehydration). °· Diseases or conditions such as Parkinson's disease, high blood pressure (hypertension), diabetes, and thyroid problems. °· Exposure to extreme heat. °DIAGNOSIS  °Your health care provider will ask about your symptoms, perform a physical exam, and perform an electrocardiogram (ECG) to record the electrical activity of your heart. Your health care provider may also perform other heart or blood tests to determine the cause of your dizziness. These may include: °· Transthoracic echocardiogram (TTE). During echocardiography, sound waves are used to evaluate how blood flows through your heart. °· Transesophageal echocardiogram (TEE). °· Cardiac monitoring. This allows your health care provider to monitor your heart rate and rhythm in real time. °· Holter monitor. This is a portable device that records your heartbeat and can help diagnose heart arrhythmias. It allows your health care provider to track your heart activity for several days if needed. °· Stress tests by exercise or by giving medicine that makes the heart beat faster. °TREATMENT  °Treatment of dizziness depends on the cause of your symptoms and can vary   greatly. °HOME CARE INSTRUCTIONS  °· Drink enough fluids to keep your urine clear or pale yellow. This is especially important in very hot weather. In older adults, it is also important in cold weather. °· Take your medicine exactly as directed if your dizziness is caused by medicines. When taking blood pressure medicines, it is especially important to get up slowly. °¨ Rise slowly from chairs and steady yourself until you feel okay. °¨ In the morning, first sit up on the side of the bed. When you feel okay,  stand slowly while holding onto something until you know your balance is fine. °· Move your legs often if you need to stand in one place for a long time. Tighten and relax your muscles in your legs while standing. °· Have someone stay with you for 1-2 days if dizziness continues to be a problem. Do this until you feel you are well enough to stay alone. Have the person call your health care provider if he or she notices changes in you that are concerning. °· Do not drive or use heavy machinery if you feel dizzy. °· Do not drink alcohol. °SEEK IMMEDIATE MEDICAL CARE IF:  °· Your dizziness or light-headedness gets worse. °· You feel nauseous or vomit. °· You have problems talking, walking, or using your arms, hands, or legs. °· You feel weak. °· You are not thinking clearly or you have trouble forming sentences. It may take a friend or family member to notice this. °· You have chest pain, abdominal pain, shortness of breath, or sweating. °· Your vision changes. °· You notice any bleeding. °· You have side effects from medicine that seems to be getting worse rather than better. °MAKE SURE YOU:  °· Understand these instructions. °· Will watch your condition. °· Will get help right away if you are not doing well or get worse. °Document Released: 06/04/2001 Document Revised: 12/14/2013 Document Reviewed: 06/28/2011 °[employer]® Patient Information ©2015 [employer]. This information is not intended to replace advice given to you by your health care provider. Make sure you discuss any questions you have with your health care provider. ° °

## 2015-07-16 NOTE — ED Notes (Signed)
To hallway bed via EMS.  Onset 1 week intermittant dizziness, shortness of breath and post nasal drip.  Pt seen PCP, dx with fluid on ears.  Dizziness, shortness of breath will last 2-3 minutes and will resolve when pt rests and is not hot.  Onset today 2:15P while driving pt got dizziness (off balance) and hot, pulled over to let son drive.  Symptoms have improved since arriving at ED.  Pt reports nasal congestion.

## 2015-12-15 ENCOUNTER — Other Ambulatory Visit: Payer: Self-pay | Admitting: Obstetrics and Gynecology

## 2015-12-15 ENCOUNTER — Other Ambulatory Visit (HOSPITAL_COMMUNITY)
Admission: RE | Admit: 2015-12-15 | Discharge: 2015-12-15 | Disposition: A | Discharge status: Home / Self Care | Payer: 59 | Source: Ambulatory Visit | Attending: Obstetrics and Gynecology | Admitting: Obstetrics and Gynecology

## 2015-12-15 DIAGNOSIS — Z01419 Encounter for gynecological examination (general) (routine) without abnormal findings: Secondary | ICD-10-CM | POA: Insufficient documentation

## 2015-12-19 LAB — CYTOLOGY - PAP

## 2016-12-19 ENCOUNTER — Other Ambulatory Visit (HOSPITAL_COMMUNITY)
Admission: RE | Admit: 2016-12-19 | Discharge: 2016-12-19 | Disposition: A | Discharge status: Home / Self Care | Payer: 59 | Source: Ambulatory Visit | Attending: Obstetrics and Gynecology | Admitting: Obstetrics and Gynecology

## 2016-12-19 ENCOUNTER — Other Ambulatory Visit: Payer: Self-pay | Admitting: Obstetrics and Gynecology

## 2016-12-19 DIAGNOSIS — Z1151 Encounter for screening for human papillomavirus (HPV): Secondary | ICD-10-CM | POA: Diagnosis not present

## 2016-12-19 DIAGNOSIS — Z01419 Encounter for gynecological examination (general) (routine) without abnormal findings: Secondary | ICD-10-CM | POA: Diagnosis present

## 2017-01-15 LAB — CYTOLOGY - PAP
Diagnosis: NEGATIVE
HPV (WINDOPATH): NOT DETECTED

## 2017-01-22 DIAGNOSIS — M79671 Pain in right foot: Secondary | ICD-10-CM | POA: Diagnosis not present

## 2017-02-27 DIAGNOSIS — R030 Elevated blood-pressure reading, without diagnosis of hypertension: Secondary | ICD-10-CM | POA: Diagnosis not present

## 2017-06-27 DIAGNOSIS — M7061 Trochanteric bursitis, right hip: Secondary | ICD-10-CM | POA: Diagnosis not present

## 2017-06-27 DIAGNOSIS — R030 Elevated blood-pressure reading, without diagnosis of hypertension: Secondary | ICD-10-CM | POA: Diagnosis not present

## 2017-06-27 DIAGNOSIS — M79661 Pain in right lower leg: Secondary | ICD-10-CM | POA: Diagnosis not present

## 2017-06-27 DIAGNOSIS — Z0000 Encounter for general adult medical examination without abnormal findings: Secondary | ICD-10-CM | POA: Diagnosis not present

## 2017-06-27 DIAGNOSIS — Z13220 Encounter for screening for lipoid disorders: Secondary | ICD-10-CM | POA: Diagnosis not present

## 2017-10-06 ENCOUNTER — Emergency Department (HOSPITAL_COMMUNITY): Payer: 59

## 2017-10-06 ENCOUNTER — Emergency Department (HOSPITAL_COMMUNITY)
Admission: EM | Admit: 2017-10-06 | Discharge: 2017-10-07 | Disposition: A | Discharge status: Home / Self Care | Payer: 59 | Attending: Emergency Medicine | Admitting: Emergency Medicine

## 2017-10-06 ENCOUNTER — Encounter (HOSPITAL_COMMUNITY): Payer: Self-pay | Admitting: Emergency Medicine

## 2017-10-06 DIAGNOSIS — R109 Unspecified abdominal pain: Secondary | ICD-10-CM | POA: Diagnosis not present

## 2017-10-06 DIAGNOSIS — R1031 Right lower quadrant pain: Secondary | ICD-10-CM | POA: Diagnosis present

## 2017-10-06 LAB — COMPREHENSIVE METABOLIC PANEL
ALT: 11 U/L — ABNORMAL LOW (ref 14–54)
AST: 20 U/L (ref 15–41)
Albumin: 4.3 g/dL (ref 3.5–5.0)
Alkaline Phosphatase: 96 U/L (ref 38–126)
Anion gap: 11 (ref 5–15)
BILIRUBIN TOTAL: 0.5 mg/dL (ref 0.3–1.2)
BUN: 8 mg/dL (ref 6–20)
CHLORIDE: 101 mmol/L (ref 101–111)
CO2: 28 mmol/L (ref 22–32)
CREATININE: 0.69 mg/dL (ref 0.44–1.00)
Calcium: 9.3 mg/dL (ref 8.9–10.3)
GFR calc Af Amer: >60 mL/min (ref >60)
GFR calc non Af Amer: >60 mL/min (ref >60)
Glucose, Bld: 93 mg/dL (ref 65–99)
Potassium: 3.4 mmol/L — ABNORMAL LOW (ref 3.5–5.1)
Sodium: 140 mmol/L (ref 135–145)
Total Protein: 8.7 g/dL — ABNORMAL HIGH (ref 6.5–8.1)

## 2017-10-06 LAB — CBC
HCT: 43 % (ref 36.0–46.0)
Hemoglobin: 13.9 g/dL (ref 12.0–15.0)
MCH: 26.1 pg (ref 26.0–34.0)
MCHC: 32.3 g/dL (ref 30.0–36.0)
MCV: 80.7 fL (ref 78.0–100.0)
Platelets: 239 10*3/uL (ref 150–400)
RBC: 5.33 MIL/uL — ABNORMAL HIGH (ref 3.87–5.11)
RDW: 14.1 % (ref 11.5–15.5)
WBC: 8.2 10*3/uL (ref 4.0–10.5)

## 2017-10-06 LAB — URINALYSIS, ROUTINE W REFLEX MICROSCOPIC
Bilirubin Urine: NEGATIVE
GLUCOSE, UA: NEGATIVE mg/dL
HGB URINE DIPSTICK: NEGATIVE
KETONES UR: 5 mg/dL — AB
LEUKOCYTES UA: NEGATIVE
Nitrite: NEGATIVE
PROTEIN: NEGATIVE mg/dL
Specific Gravity, Urine: 1.018 (ref 1.005–1.030)
pH: 7 (ref 5.0–8.0)

## 2017-10-06 LAB — LIPASE, BLOOD: Lipase: 16 U/L (ref 11–51)

## 2017-10-06 MED ORDER — IOPAMIDOL (ISOVUE-300) INJECTION 61%
100.0000 mL | Freq: Once | INTRAVENOUS | Status: AC | PRN
  Administered 2017-10-06: 100 mL via INTRAVENOUS

## 2017-10-06 MED ORDER — SODIUM CHLORIDE 0.9 % IV BOLUS (SEPSIS)
1000.0000 mL | Freq: Once | INTRAVENOUS | Status: AC
  Administered 2017-10-06: 1000 mL via INTRAVENOUS

## 2017-10-06 MED ORDER — IOPAMIDOL (ISOVUE-300) INJECTION 61%
INTRAVENOUS | Status: AC
  Filled 2017-10-06: qty 100

## 2017-10-06 NOTE — ED Provider Notes (Signed)
[facility]-EMERGENCY DEPT Provider Note   CSN: [id number] Arrival date & time: 10/06/17  1143     History   Chief Complaint Chief Complaint  Patient presents with  . Abdominal Pain    HPI Deanna Mcfarland is a 56 y.o. female.  HPI  56 y.o. female, presents to the Emergency Department today due to RLQ abdominal pain this morning. Notes intermittent pain with this AM as well as in waiting room of ED. Notes pressure like sensation and rates 4/10. Worse with palpation and ROM. No N/V/D. No CP/SOB. No dysuria. No vaginal bleeding/discharge. No fevers. Normal BMs with movement this morning. No meds PTA. No other symptoms noted    History reviewed. No pertinent past medical history.  Patient Active Problem List   Diagnosis Date Noted  . Palpitations 07/01/2013  . Seasonal allergies 07/01/2013    Past Surgical History:  Procedure Laterality Date  . TUBAL LIGATION      OB History    No data available       Home Medications    Prior to Admission medications   Medication Sig Start Date End Date Taking? Authorizing Provider  montelukast (SINGULAIR) 10 MG tablet Take 10 mg by mouth daily as needed (allergies).    Yes [provider]  naproxen (NAPROSYN) 500 MG tablet Take 500 mg by mouth 2 (two) times daily as needed for moderate pain.   Yes [provider]  pseudoephedrine-guaifenesin (MUCINEX D) 60-600 MG 12 hr tablet Take 1 tablet by mouth every 12 (twelve) hours as needed for congestion.   Yes [provider]  azelastine (ASTELIN) 0.1 % nasal spray Place 2 sprays into both nostrils daily as needed for allergies.     [provider]  fluticasone (FLONASE) 50 MCG/ACT nasal spray Place 2 sprays into both nostrils daily as needed for allergies.     [provider]  methylPREDNISolone (MEDROL DOSEPAK) 4 MG TBPK tablet Take according to package instruction Patient not taking: Reported on 10/06/2017 07/16/15   Tilden Fossa, MD  oxymetazoline (AFRIN) 0.05 % nasal spray Place 1 spray into both nostrils 2 (two) times daily as needed for congestion. Use twice daily on flight days    [provider]    Family History Family History  Problem Relation Age of Onset  . Hypertension Mother   . Thyroid disease Mother   . Hypertension Father     Social History Social History  Substance Use Topics  . Smoking status: Never Smoker  . Smokeless tobacco: Never Used  . Alcohol use Yes     Comment: occasional wine     Allergies   Patient has no known allergies.   Review of Systems Review of Systems ROS reviewed and all are negative for acute change except as noted in the HPI.  Physical Exam Updated Vital Signs BP (!) 178/101 (BP Location: Left Arm)   Pulse 73   Temp 97.8 F (36.6 C) (Oral)   Resp 20   SpO2 99%   Physical Exam  Constitutional: She is oriented to person, place, and time. She appears well-developed and well-nourished. No distress.  HENT:  Head: Normocephalic and atraumatic.  Right Ear: Tympanic membrane, external ear and ear canal normal.  Left Ear: Tympanic membrane, external ear and ear canal normal.  Nose: Nose normal.  Mouth/Throat: Uvula is midline, oropharynx is clear and moist and mucous membranes are normal. No trismus in the jaw. No oropharyngeal exudate, posterior oropharyngeal erythema or tonsillar abscesses.  Eyes: Pupils are equal, round, and reactive to light. EOM are normal.  Neck: Normal range of motion. Neck supple. No tracheal deviation present.  Cardiovascular: Normal rate, regular rhythm, S1 normal, S2 normal, normal heart sounds, intact distal pulses and normal pulses.   Pulmonary/Chest: Effort normal and breath sounds normal. No respiratory distress. She has no decreased breath sounds. She has no wheezes. She has no rhonchi. She has no rales.  Abdominal: Normal appearance and bowel sounds are normal. There is tenderness in the right lower quadrant.  There is rebound and tenderness at McBurney's point. There is no rigidity, no guarding, no CVA tenderness and negative Murphy's sign.  Focal tenderness RLQ. Abdomen soft   Musculoskeletal: Normal range of motion.  Neurological: She is alert and oriented to person, place, and time.  Skin: Skin is warm and dry.  Psychiatric: She has a normal mood and affect. Her speech is normal and behavior is normal. Thought content normal.   ED Treatments / Results  Labs (all labs ordered are listed, but only abnormal results are displayed) Labs Reviewed  COMPREHENSIVE METABOLIC PANEL - Abnormal; Notable for the following:       Result Value   Potassium 3.4 (*)    Total Protein 8.7 (*)    ALT 11 (*)    All other components within normal limits  CBC - Abnormal; Notable for the following:    RBC 5.33 (*)    All other components within normal limits  URINALYSIS, ROUTINE W REFLEX MICROSCOPIC - Abnormal; Notable for the following:    APPearance HAZY (*)    Ketones, ur 5 (*)    All other components within normal limits  LIPASE, BLOOD    EKG  EKG Interpretation None       Radiology Ct Abdomen Pelvis W Contrast  Result Date: 10/06/2017 CLINICAL DATA:  Severe left-sided abdominal pain onset 1 hour ago. EXAM: CT ABDOMEN AND PELVIS WITH CONTRAST TECHNIQUE: Multidetector CT imaging of the abdomen and pelvis was performed using the standard protocol following bolus administration of intravenous contrast. CONTRAST:  ISOVUE-300 IOPAMIDOL (ISOVUE-300) INJECTION 61% COMPARISON:  01/01/2012 FINDINGS: Lower chest: A 9 x 10 mm ovoid density seen on the first image at the base of the right lung, image 1 series 3. A pulmonary nodule versus a partially included vascular shadow might account for this appearance. No pneumonic consolidation, effusion or pneumothorax. Heart is top-normal in size without pericardial effusion. Hepatobiliary: 7 mm hypodensity in the left hepatic lobe may represent a tiny cyst or  hemangioma but is too small to further characterize. Subtle hypodensity in the right hepatic lobe from previous exam (previous series 3, image 19) is no longer apparent and may have represented a hemangioma. No gallstones, gallbladder wall thickening, or biliary dilatation. Pancreas: Unremarkable. No pancreatic ductal dilatation or surrounding inflammatory changes. Spleen: Normal in size without focal abnormality. Adrenals/Urinary Tract: Adrenal glands are unremarkable. Kidneys are normal, without renal calculi, focal lesion, or hydronephrosis. Bladder is unremarkable. Stomach/Bowel: Stomach is within normal limits. Appendix appears normal. No evidence of bowel wall thickening, distention, or inflammatory changes. Vascular/Lymphatic: No significant vascular findings are present. No enlarged abdominal or pelvic lymph nodes. Reproductive: Uterus and bilateral adnexa are unremarkable. Other: No abdominal wall hernia or abnormality. No abdominopelvic ascites. Musculoskeletal: Degenerative disc disease L5-S1. No acute nor suspicious osseous abnormalities. IMPRESSION: 1. Partially imaged 9 x 10 mm ovoid density in the right lower lobe medially may be a normal vascular structure incompletely imaged. A pulmonary nodule is  not entirely excluded. Consider one of the following in 3 months for both low-risk and high-risk individuals: (a) repeat chest CT, (b) follow-up PET-CT, or (c) tissue sampling. This recommendation follows the consensus statement: Guidelines for Management of Incidental Pulmonary Nodules Detected on CT Images: From the Fleischner Society 2017; Radiology 2017; 284:228-243. 2. 7 mm left hepatic hypodensity too small to further characterize but statistically consistent with a cyst or hemangioma. 3. No acute bowel obstruction or inflammation. No free air nor abscess. 4. Degenerative disc disease L5-S1. Electronically Signed   By: Tollie Eth M.D.   On: 10/06/2017 23:51    Procedures Procedures (including  critical care time)  Medications Ordered in ED Medications  iopamidol (ISOVUE-300) 61 % injection (not administered)  sodium chloride 0.9 % bolus 1,000 mL (0 mLs Intravenous Stopped 10/06/17 2324)  iopamidol (ISOVUE-300) 61 % injection 100 mL (100 mLs Intravenous Contrast Given 10/06/17 2304)     Initial Impression / Assessment and Plan / ED Course  I have reviewed the triage vital signs and the nursing notes.  Pertinent labs & imaging results that were available during my care of the patient were reviewed by me and considered in my medical decision making (see chart for details).  Final Clinical Impressions(s) / ED Diagnoses  {I have reviewed and evaluated the relevant laboratory values. {I have reviewed and evaluated the relevant imaging studies.  {I have reviewed the relevant previous healthcare records.  {I obtained HPI from historian.   ED Course:  Assessment: Pt is a 56 y.o. female  presents to the Emergency Department today due to RLQ abdominal pain this morning. Notes intermittent pain with this AM as well as in waiting room of ED. Notes pressure like sensation and rates 4/10. Worse with palpation and ROM. No N/V/D. No CP/SOB. No dysuria. No vaginal bleeding/discharge. No fevers. Normal BMs with movement this morning. No meds PTA.. On exam, pt in NAD. Nontoxic/nonseptic appearing. VSS. Afebrile. Lungs CTA. Heart RRR. Abdomen with focal TTP RLQ. Concern for appy. CBC unremarkable. CT Abdomen/pelvis unremarkable for abdomen. CMP unremarkable. Lipase negative. Given fluids in ED. Pt without symptoms after fluids. No acute abnormality identified. Strict return precautions given. Plan is to DC home with follow up to PCP. At time of discharge, Patient is in no acute distress. Vital Signs are stable. Patient is able to ambulate. Patient able to tolerate PO.   Disposition/Plan:  DC Home Additional Verbal discharge instructions given and discussed with patient.  Pt Instructed to f/u with PCP  in the next week for evaluation and treatment of symptoms. Return precautions given Pt acknowledges and agrees with plan  Supervising Physician Bethann Berkshire, MD  Final diagnoses:  Right lower quadrant abdominal pain    New Prescriptions New Prescriptions   No medications on file     Audry Pili, Cordelia Poche 10/07/17 0016    Bethann Berkshire, MD 10/09/17 [phone]

## 2017-10-06 NOTE — ED Triage Notes (Signed)
Pt complaint of severe left abd pain onset an hour ago; denies n/v/d or GU symptoms.

## 2017-10-06 NOTE — ED Notes (Signed)
Pt in CT will recollect vital signs when patient returns.

## 2017-10-07 MED ORDER — DICYCLOMINE HCL 20 MG PO TABS: 20.0000 mg | ORAL_TABLET | Freq: Two times a day (BID) | ORAL | 0 refills | Status: DC

## 2017-10-07 MED ORDER — IBUPROFEN 600 MG PO TABS: 600.0000 mg | ORAL_TABLET | Freq: Four times a day (QID) | ORAL | 0 refills | Status: DC | PRN

## 2017-10-07 NOTE — ED Notes (Signed)
IV site discontinued-catheter intact.

## 2017-10-07 NOTE — Discharge Instructions (Signed)
Please read and follow all provided instructions.  Your diagnoses today include:  1. Right lower quadrant abdominal pain     Tests performed today include: Blood counts and electrolytes Blood tests to check liver and kidney function Blood tests to check pancreas function Urine test to look for infection and pregnancy (in women) Vital signs. See below for your results today.   Medications prescribed:   Take any prescribed medications only as directed.  Home care instructions:  Follow any educational materials contained in this packet.  Follow-up instructions: Please follow-up with your primary care provider in the next 2 days for further evaluation of your symptoms.    Return instructions:  SEEK IMMEDIATE MEDICAL ATTENTION IF: The pain does not go away or becomes severe  A temperature above 101F develops  Repeated vomiting occurs (multiple episodes)  The pain becomes localized to portions of the abdomen. The right side could possibly be appendicitis. In an adult, the left lower portion of the abdomen could be colitis or diverticulitis.  Blood is being passed in stools or vomit (bright red or black tarry stools)  You develop chest pain, difficulty breathing, dizziness or fainting, or become confused, poorly responsive, or inconsolable (young children) If you have any other emergent concerns regarding your health  Additional Information: Abdominal (belly) pain can be caused by many things. Your caregiver performed an examination and possibly ordered blood/urine tests and imaging (CT scan, x-rays, ultrasound). Many cases can be observed and treated at home after initial evaluation in the emergency department. Even though you are being discharged home, abdominal pain can be unpredictable. Therefore, you need a repeated exam if your pain does not resolve, returns, or worsens. Most patients with abdominal pain don't have to be admitted to the hospital or have surgery, but serious problems  like appendicitis and gallbladder attacks can start out as nonspecific pain. Many abdominal conditions cannot be diagnosed in one visit, so follow-up evaluations are very important.  Your vital signs today were: BP 135/86 (BP Location: Right Arm)    Pulse 66    Temp 97.8 F (36.6 C) (Oral)    Resp 14    SpO2 100%  If your blood pressure (bp) was elevated above 135/85 this visit, please have this repeated by your doctor within one month. --------------

## 2017-12-25 DIAGNOSIS — H524 Presbyopia: Secondary | ICD-10-CM | POA: Diagnosis not present

## 2018-01-13 DIAGNOSIS — Z01419 Encounter for gynecological examination (general) (routine) without abnormal findings: Secondary | ICD-10-CM | POA: Diagnosis not present

## 2018-02-25 DIAGNOSIS — J101 Influenza due to other identified influenza virus with other respiratory manifestations: Secondary | ICD-10-CM | POA: Diagnosis not present

## 2018-02-25 DIAGNOSIS — J029 Acute pharyngitis, unspecified: Secondary | ICD-10-CM | POA: Diagnosis not present

## 2018-03-15 DIAGNOSIS — Z719 Counseling, unspecified: Secondary | ICD-10-CM | POA: Diagnosis not present

## 2018-03-26 DIAGNOSIS — B370 Candidal stomatitis: Secondary | ICD-10-CM | POA: Diagnosis not present

## 2018-03-26 DIAGNOSIS — J208 Acute bronchitis due to other specified organisms: Secondary | ICD-10-CM | POA: Diagnosis not present

## 2018-03-27 DIAGNOSIS — Z719 Counseling, unspecified: Secondary | ICD-10-CM | POA: Diagnosis not present

## 2018-04-02 DIAGNOSIS — Z719 Counseling, unspecified: Secondary | ICD-10-CM | POA: Diagnosis not present

## 2018-04-21 DIAGNOSIS — Z719 Counseling, unspecified: Secondary | ICD-10-CM | POA: Diagnosis not present

## 2018-07-02 DIAGNOSIS — Z0000 Encounter for general adult medical examination without abnormal findings: Secondary | ICD-10-CM | POA: Diagnosis not present

## 2018-07-02 DIAGNOSIS — Z23 Encounter for immunization: Secondary | ICD-10-CM | POA: Diagnosis not present

## 2018-07-02 DIAGNOSIS — Z13220 Encounter for screening for lipoid disorders: Secondary | ICD-10-CM | POA: Diagnosis not present

## 2018-07-02 DIAGNOSIS — R911 Solitary pulmonary nodule: Secondary | ICD-10-CM | POA: Diagnosis not present

## 2018-07-02 DIAGNOSIS — J309 Allergic rhinitis, unspecified: Secondary | ICD-10-CM | POA: Diagnosis not present

## 2018-07-06 ENCOUNTER — Other Ambulatory Visit: Payer: Self-pay | Admitting: Family Medicine

## 2018-07-06 DIAGNOSIS — R911 Solitary pulmonary nodule: Secondary | ICD-10-CM

## 2018-07-23 ENCOUNTER — Ambulatory Visit
Admission: RE | Admit: 2018-07-23 | Discharge: 2018-07-23 | Disposition: A | Discharge status: Home / Self Care | Payer: 59 | Source: Ambulatory Visit | Attending: Family Medicine | Admitting: Family Medicine

## 2018-07-23 DIAGNOSIS — I517 Cardiomegaly: Secondary | ICD-10-CM | POA: Diagnosis not present

## 2018-07-23 DIAGNOSIS — R911 Solitary pulmonary nodule: Secondary | ICD-10-CM

## 2018-12-11 DIAGNOSIS — Z1231 Encounter for screening mammogram for malignant neoplasm of breast: Secondary | ICD-10-CM | POA: Diagnosis not present

## 2019-01-08 DIAGNOSIS — Z23 Encounter for immunization: Secondary | ICD-10-CM | POA: Diagnosis not present

## 2019-01-19 DIAGNOSIS — Z01419 Encounter for gynecological examination (general) (routine) without abnormal findings: Secondary | ICD-10-CM | POA: Diagnosis not present

## 2019-08-02 ENCOUNTER — Other Ambulatory Visit: Payer: Self-pay

## 2019-08-02 ENCOUNTER — Encounter: Payer: 59 | Admitting: Family Medicine

## 2019-08-02 NOTE — Progress Notes (Signed)
This encounter was created in error - please disregard.

## 2019-12-10 ENCOUNTER — Other Ambulatory Visit: Payer: Self-pay

## 2019-12-10 DIAGNOSIS — Z20822 Contact with and (suspected) exposure to covid-19: Secondary | ICD-10-CM

## 2019-12-11 LAB — NOVEL CORONAVIRUS, NAA: SARS-CoV-2, NAA: NOT DETECTED

## 2020-09-25 DIAGNOSIS — G5620 Lesion of ulnar nerve, unspecified upper limb: Secondary | ICD-10-CM | POA: Insufficient documentation

## 2020-10-03 ENCOUNTER — Ambulatory Visit (INDEPENDENT_AMBULATORY_CARE_PROVIDER_SITE_OTHER): Payer: 59 | Admitting: Nurse Practitioner

## 2020-10-03 VITALS — BP 136/88 | HR 80 | Temp 97.1°F | Ht 67.0 in | Wt 199.0 lb

## 2020-10-03 DIAGNOSIS — R439 Unspecified disturbances of smell and taste: Secondary | ICD-10-CM | POA: Diagnosis not present

## 2020-10-03 DIAGNOSIS — Z8616 Personal history of COVID-19: Secondary | ICD-10-CM

## 2020-10-03 NOTE — Progress Notes (Signed)
@Patient  ID: , female    DOB: Jan 29, 1961, 59 y.o.   MRN: [id number]  Chief Complaint  Patient presents with  . New Patient (Initial Visit)    COVID Pos: 12/2019 Sx: bad taste and smell. "gasoline and food all smell the same"    Referring provider: 01/2020, MD   59 year old female with no significant health history.  HPI  Patient presents today for post COVID care clinic visit.  She was diagnosed with Covid in January 2021.  She complains today of ongoing altered taste and smell.  She states that steaks taste like gasoline and smelling gasoline to her.  Certain foods taste horrible.  She states that she can tolerate some vegetables and fish but states that the visually has no taste to her.  Patient states that she has lost 50 pounds since January due to not having any appetite. Denies f/c/s, n/v/d, hemoptysis, PND, chest pain or edema.      No Known Allergies   There is no immunization history on file for this patient.  History reviewed. No pertinent past medical history.  Tobacco History: Social History   Tobacco Use  Smoking Status Never Smoker  Smokeless Tobacco Never Used   Counseling given: Yes   Outpatient Encounter Medications as of 10/03/2020  Medication Sig  . azelastine (ASTELIN) 0.1 % nasal spray Place 2 sprays into both nostrils daily as needed for allergies.   . fluticasone (FLONASE) 50 MCG/ACT nasal spray Place 2 sprays into both nostrils daily as needed for allergies.   12/03/2020 oxymetazoline (AFRIN) 0.05 % nasal spray Place 1 spray into both nostrils 2 (two) times daily as needed for congestion. Use twice daily on flight days  . Cetirizine HCl (ZYRTEC ALLERGY) 10 MG CAPS Zyrtec  . ibuprofen (ADVIL,MOTRIN) 600 MG tablet Take 1 tablet (600 mg total) by mouth every 6 (six) hours as needed. (Patient not taking: Reported on 10/03/2020)  . montelukast (SINGULAIR) 10 MG tablet Take 10 mg by mouth daily as needed (allergies).  (Patient not taking:  Reported on 10/03/2020)  . naproxen (NAPROSYN) 500 MG tablet Take 500 mg by mouth 2 (two) times daily as needed for moderate pain. (Patient not taking: Reported on 10/03/2020)  . pseudoephedrine-guaifenesin (MUCINEX D) 60-600 MG 12 hr tablet Take 1 tablet by mouth every 12 (twelve) hours as needed for congestion. (Patient not taking: Reported on 10/03/2020)  . triamterene-hydrochlorothiazide (MAXZIDE-25) 37.5-25 MG tablet triamterene 37.5 mg-hydrochlorothiazide 25 mg tablet  TAKE 1 TABLET BY MOUTH ONCE DAILY IN THE MORNING  . [DISCONTINUED] dicyclomine (BENTYL) 20 MG tablet Take 1 tablet (20 mg total) by mouth 2 (two) times daily. (Patient not taking: Reported on 10/03/2020)  . [DISCONTINUED] methylPREDNISolone (MEDROL DOSEPAK) 4 MG TBPK tablet Take according to package instruction (Patient not taking: Reported on 10/06/2017)   No facility-administered encounter medications on file as of 10/03/2020.     Review of Systems  Review of Systems  Constitutional: Negative.  Negative for fatigue and fever.  HENT: Negative.   Respiratory: Negative for cough and shortness of breath.   Cardiovascular: Negative.  Negative for chest pain, palpitations and leg swelling.  Gastrointestinal: Negative.   Allergic/Immunologic: Negative.   Neurological: Negative.        Altered taste and smell.   Psychiatric/Behavioral: Negative.        Physical Exam  BP 136/88 (BP Location: Left Arm)   Pulse 80   Temp (!) 97.1 F (36.2 C)   Ht 5\' 7"  (1.702 m)  Wt 199 lb (90.3 kg)   SpO2 99%   BMI 31.17 kg/m   Wt Readings from Last 5 Encounters:  10/03/20 199 lb (90.3 kg)  07/01/13 224 lb 4.8 oz (101.7 kg)     Physical Exam Vitals and nursing note reviewed.  Constitutional:      General: She is not in acute distress.    Appearance: She is well-developed.  Cardiovascular:     Rate and Rhythm: Normal rate and regular rhythm.  Pulmonary:     Effort: Pulmonary effort is normal.     Breath sounds:  Normal breath sounds.  Musculoskeletal:     Right lower leg: No edema.     Left lower leg: No edema.  Neurological:     Mental Status: She is alert and oriented to person, place, and time.  Psychiatric:        Mood and Affect: Mood normal.        Behavior: Behavior normal.        Assessment & Plan:   History of COVID-19 Altered taste and smell:  May trial online olfactory re-training  Will place referral to Dr. Vickey Huger - neurology - olfactory dysfunction  Stay well hydrated   Follow up:  Follow up after neurology appointment if needed      Ivonne Andrew, NP 10/03/2020

## 2020-10-03 NOTE — Assessment & Plan Note (Signed)
Altered taste and smell:  May trial online olfactory re-training  Will place referral to Dr. Vickey Huger - neurology - olfactory dysfunction  Stay well hydrated   Follow up:  Follow up after neurology appointment if needed

## 2020-10-03 NOTE — Patient Instructions (Signed)
History of Covid Altered taste and smell:  May trial online olfactory re-training  Will place referral to Dr. Vickey Huger - neurology - olfactory dysfunction  Stay well hydrated   Follow up:  Follow up after neurology appointment if needed

## 2020-10-12 ENCOUNTER — Other Ambulatory Visit: Payer: Self-pay | Admitting: Family Medicine

## 2020-10-12 DIAGNOSIS — R634 Abnormal weight loss: Secondary | ICD-10-CM

## 2020-10-30 ENCOUNTER — Ambulatory Visit
Admission: RE | Admit: 2020-10-30 | Discharge: 2020-10-30 | Disposition: A | Discharge status: Home / Self Care | Payer: 59 | Source: Ambulatory Visit | Attending: Family Medicine | Admitting: Family Medicine

## 2020-10-30 DIAGNOSIS — R634 Abnormal weight loss: Secondary | ICD-10-CM

## 2020-10-30 MED ORDER — IOPAMIDOL (ISOVUE-300) INJECTION 61%
100.0000 mL | Freq: Once | INTRAVENOUS | Status: AC | PRN
  Administered 2020-10-30: 100 mL via INTRAVENOUS

## 2020-11-09 ENCOUNTER — Other Ambulatory Visit: Payer: Self-pay | Admitting: Family Medicine

## 2020-11-09 DIAGNOSIS — R430 Anosmia: Secondary | ICD-10-CM

## 2020-11-09 DIAGNOSIS — R634 Abnormal weight loss: Secondary | ICD-10-CM

## 2020-11-23 ENCOUNTER — Ambulatory Visit
Admission: RE | Admit: 2020-11-23 | Discharge: 2020-11-23 | Disposition: A | Discharge status: Home / Self Care | Payer: 59 | Source: Ambulatory Visit | Attending: Family Medicine | Admitting: Family Medicine

## 2020-11-23 DIAGNOSIS — R430 Anosmia: Secondary | ICD-10-CM

## 2020-11-23 DIAGNOSIS — R634 Abnormal weight loss: Secondary | ICD-10-CM

## 2020-11-23 MED ORDER — GADOBENATE DIMEGLUMINE 529 MG/ML IV SOLN
19.0000 mL | Freq: Once | INTRAVENOUS | Status: AC | PRN
  Administered 2020-11-23: 19 mL via INTRAVENOUS

## 2021-01-15 ENCOUNTER — Ambulatory Visit: Payer: 59 | Admitting: Neurology

## 2021-01-15 ENCOUNTER — Other Ambulatory Visit: Payer: Self-pay

## 2021-01-15 ENCOUNTER — Encounter: Payer: Self-pay | Admitting: Neurology

## 2021-01-15 DIAGNOSIS — R439 Unspecified disturbances of smell and taste: Secondary | ICD-10-CM

## 2021-01-15 DIAGNOSIS — R438 Other disturbances of smell and taste: Secondary | ICD-10-CM

## 2021-01-15 DIAGNOSIS — U099 Post covid-19 condition, unspecified: Secondary | ICD-10-CM

## 2021-01-15 NOTE — Progress Notes (Addendum)
Provider:  Melvyn Novas, M D  Referring Provider: Ivonne Andrew, NP Primary Care Physician:  Laurann Montana, MD  Chief Complaint  Patient presents with  . Follow-up    Pt alone, rm 11. Presents today to reassess loss of taste and smell.  Patient had covid January 2021 had lost her taste and smell but initially got it back.  In March/April she began to lose her taste and smell again and it has not fully returned.     HPI:  Deanna Mcfarland is a 60 y.o. female of [place] descent and  is seen here upon a referral  from [place]  [facility] for anosmia and ageusia, associated with COVID- late January 2021. Just a month before her vaccinate date. She lost a lot of weight as her apetite was decreased.  She felt her taste was off, a lot f things she used to like to eat were tasting bad.  She noted the loss of taste first, as she was used to less olfactory sensory after chronic sinusitis.   She was not able to smell smoke- a safety issue.  It hasn't come back yet, 12 month later.  She was a vaccinated in March 2021 with [employer].     Battery of smell and taste samples;  Rum was identified as Vanilla, and  Taste is described as awful, like a cleaning detergent.  Clove was identified correctly, tasted nothing.  Grapefruit was identified as orange or lemon, in the same family. No taste.  Vanilla was not identified, but she had a scent sensation, alcohol based. No taste.  Lavender - not identified, smelled  " minty". Peppermint was identified by scent, and tasted minty.  Coconut was identified by scent not taste.        Review of Systems: Out of a complete 14 system review, the patient complains of only the following symptoms, and all other reviewed systems are negative.  Hair loss after Covid 19 at about 6-12 weeks.   Ageusia  Some anosmia.  She still can't smell smoke. Cant smell if food is spoiled.  She can vaguely smell perfumed body care products.    Social History    Socioeconomic History  . Marital status: Married    Spouse name: Not on file  . Number of children: Not on file  . Years of education: Not on file  . Highest education level: Not on file  Occupational History  . Not on file  Tobacco Use  . Smoking status: Never Smoker  . Smokeless tobacco: Never Used  Substance and Sexual Activity  . Alcohol use: Yes    Comment: occasional wine  . Drug use: No  . Sexual activity: Not on file  Other Topics Concern  . Not on file  Social History Narrative  . Not on file   Social Determinants of Health   Financial Resource Strain: Not on file  Food Insecurity: Not on file  Transportation Needs: Not on file  Physical Activity: Not on file  Stress: Not on file  Social Connections: Not on file  Intimate Partner Violence: Not on file    Family History  Problem Relation Age of Onset  . Hypertension Mother   . Thyroid disease Mother   . Hypertension Father     No past medical history on file.  Past Surgical History:  Procedure Laterality Date  . TUBAL LIGATION      Current Outpatient Medications  Medication Sig Dispense Refill  .  amLODipine (NORVASC) 2.5 MG tablet 1 tablet    . ascorbic acid (VITAMIN C) 1000 MG tablet Take 1,000 mg by mouth daily.    . Calcium Carbonate (CALCIUM 500 PO) Take by mouth.    . Cetirizine HCl (ZYRTEC ALLERGY) 10 MG CAPS Zyrtec    . cholecalciferol (VITAMIN D3) 25 MCG (1000 UNIT) tablet Take 1,000 Units by mouth daily.    . fluticasone (FLONASE) 50 MCG/ACT nasal spray Place 2 sprays into both nostrils daily as needed for allergies.     . Misc Natural Products (TURMERIC CURCUMIN) CAPS See admin instructions.    . Multiple Vitamin (MULTIVITAMIN) tablet Take 1 tablet by mouth daily.    . Zinc Sulfate (ZINC 15 PO) Take by mouth.     No current facility-administered medications for this visit.    Allergies as of 01/15/2021  . (No Known Allergies)    Vitals: There were no vitals taken for this  visit. Last Weight:  Wt Readings from Last 1 Encounters:  10/03/20 199 lb (90.3 kg)   Last Height:   Ht Readings from Last 1 Encounters:  10/03/20 5\' 7"  (1.702 m)    Physical exam:  General: The patient is awake, alert and appears not in acute distress. The patient is well groomed. Head: Normocephalic, atraumatic. Neck is supple. Mallampati 2 , neck circumference:14 Cardiovascular:  Regular rate and rhythm, without  murmurs or carotid bruit, and without distended neck veins. Respiratory: Lungs are clear to auscultation. Skin:  Without evidence of edema, or rash Trunk: BMI is elevated, patient had lost 45 pounds.   Neurologic exam : The patient is awake and alert, oriented to place and time.   Memory subjective  described as intact. There is a normal attention span & concentration ability. Speech is fluent without  dysarthria, dysphonia or aphasia. Mood and affect are appropriate.  Cranial nerves: Pupils are equal and briskly reactive to light. Funduscopic exam deferred. . Extraocular movements  in vertical and horizontal planes intact and without nystagmus.  Visual fields by finger perimetry are intact. Hearing to finger rub intact.  Facial sensation intact to fine touch. Facial motor strength is symmetric and tongue and uvula move midline. Tongue protrusion into either cheek is normal. Shoulder shrug is normal.   Motor exam: Normal tone ,muscle bulk and symmetric  strength in all extremities. Ulnar nerve repair just dec 10th last year, a little over 1 month ago.   Sensory:  Fine touch and vibration were normal.  Coordination: no  evidence of ataxia, dysmetria or tremor.  Gait and station: Patient walks without assistive device  Deep tendon reflexes: in the  upper and lower extremities are symmetric and intact.    Assessment:  After physical and neurologic examination, review of laboratory studies, imaging, neurophysiology testing and pre-existing records, assessment is that of  :   COVID related ageusia and anosmia.   Stronger los of taste than of smell.   Hair loss with covid at about 6-12 weeks.   Plan:  Treatment plan and additional workup :  Association training to recover and redevelop smell spectrum.    Dec 12 Rufina Kimery MD 01/15/2021

## 2021-01-15 NOTE — Patient Instructions (Signed)
Loss of smell, also known as ANOSMIA, is one of the most common symptoms of COVID-19.   [facility] Professor of Otolaryngology Loetta Rough, MD, and Assistant Professor Monika Salk, MD, have investigated several treatments for persistent anosmia (loss of smell), with a special interest in viral-related anosmia.     As the number of total, confirmed COVID-19 cases increases so does the number of people suffering from disease-related anosmia, making anosmia a significant public health problem.     Rehabilitation of anosmia :   Smell daily one sample of the following categories; and look at a picture of the object - f. Example at a picture of a lemon while smelling a lemon oil.   A floral scent  - such as rose, lavender, or vanilla - all would qualify for these.  A spice scent - nutmeg, anise, coffee, cinnamon-  A citrus smell - lemon, lime, orange  A menthol or minty scent, or eucalyptus.   Goal is to re-associate scent ( and eventually taste ) to the image.  The success is gradual, and this form of rehabilitation may take 12 month to succeed.   Another complication is PAROSMIA - smelling something that is not present, often an unpleasant smell of burning rubber or spoiled , foul smells.   This can be reduced by using a carbamazepine at 100 mg po bid. This antiepileptic medication slows the nerve conduction and allows the reduction in abnormal smell sensation.    Melvyn Novas, MD       Treatment of ANOSMIA and Ageusia: If the combination of smell and vision of the same object that you're smelling improves the recovery of smell, then just smell alone. So we call that bimodal, visual and olfactory stimulation. So the standard olfactory training is four scents: rose, lemon, cloves and eucalyptus.  And the subject sniffs twice a day, 10, 12, 20 seconds each time the four essential oils for anywhere to six, eight or 12 weeks.  And olfactory training for anosmia  has been around for maybe 10, 15 years, studies suggest it works.  There's nothing absolutely definitive- but it can't hurt. It's thought to work by retraining the brain, the process of neuroplasticity, retraining the brain to smell these odors again. And we think that by smelling, you can retrain the brain. Now, one of the outstanding questions is, are you retraining the brain to smell rose, lemon, cloves and eucalyptus better, but forget about coffee, vanilla, coconut?  How generalizable is the improvement in smell? Rosanne Ashing, we asked the patients what smells would you like to pick and train on? The answer was fascinating to [place]. I thought it might be coffee as a coffee lover. I thought it might be vanilla as a vanilla-bean ice cream lover. It was smoke. The number one odor, smell, if you will, that people wanted to train on and to get better was smoke for the reasons that we talked about before.  The sense of smell is first and foremost attached to safety. And these people felt that if they could smell smoke better, they would feel more safe.  Unfortunately, there isn't a smoke essential oil so we couldn't do that.  But it just reinforced to [place] how important it is for patients to pick what smells they want to train on.  The other unique aspect that should be brought up is that half of the group are looking at high-quality pictures of the item, one of four items they picked, and each  each one of them have high-quality photographs presented on their iPhone or iPad at the same time that they smell.  

## 2021-03-01 DIAGNOSIS — M25519 Pain in unspecified shoulder: Secondary | ICD-10-CM | POA: Insufficient documentation

## 2021-03-01 DIAGNOSIS — M7502 Adhesive capsulitis of left shoulder: Secondary | ICD-10-CM | POA: Insufficient documentation

## 2021-05-15 ENCOUNTER — Telehealth: Payer: 59 | Admitting: Neurology

## 2021-05-15 DIAGNOSIS — U099 Post covid-19 condition, unspecified: Secondary | ICD-10-CM

## 2021-05-15 DIAGNOSIS — R438 Other disturbances of smell and taste: Secondary | ICD-10-CM

## 2021-05-15 DIAGNOSIS — R439 Unspecified disturbances of smell and taste: Secondary | ICD-10-CM | POA: Diagnosis not present

## 2021-05-15 NOTE — Progress Notes (Signed)
Virtual Visit via Video Note  I connected with Deanna Mcfarland on 05/15/21 at  1:30 PM EDT by a video enabled telemedicine application and verified that I am speaking with the correct person using two identifiers.  Location: Patient: at her home  Provider: at [facility] OFF[facility], [facility]   I discussed the limitations of evaluation and management by telemedicine and the availability of in person appointments. The patient expressed understanding and agreed to proceed.  History of Present Illness: Covid 19 after visit- progress of anosmia therapy.     Observations/Objective:patient has regained smell of lemons, mont, somewhat coffee, milk, floral scents..    Assessment and Plan:  Patient has benefited form re-associaton training for anosmia and has noted recovery of taste as well. Less parsomnia.    Follow Up Instructions: prn. She has a the tools to encomapss more different sells and tastes in her rehab program.     I discussed the assessment and treatment plan with the patient. The patient was provided an opportunity to ask questions and all were answered. The patient agreed with the plan and demonstrated an understanding of the instructions.   The patient was advised to call back or seek an in-person evaluation if the symptoms worsen or if the condition fails to improve as anticipated.  I provided 14 minutes of non-face-to-face time during this encounter.  RV prn-   Melvyn Novas, MD

## 2021-12-20 ENCOUNTER — Other Ambulatory Visit: Payer: Self-pay

## 2021-12-20 ENCOUNTER — Ambulatory Visit: Payer: 59 | Admitting: Cardiology

## 2021-12-20 ENCOUNTER — Encounter: Payer: Self-pay | Admitting: Cardiology

## 2021-12-20 ENCOUNTER — Ambulatory Visit (INDEPENDENT_AMBULATORY_CARE_PROVIDER_SITE_OTHER): Payer: 59

## 2021-12-20 VITALS — BP 140/90 | HR 64 | Ht 67.0 in | Wt 228.0 lb

## 2021-12-20 DIAGNOSIS — I10 Essential (primary) hypertension: Secondary | ICD-10-CM | POA: Diagnosis not present

## 2021-12-20 DIAGNOSIS — R002 Palpitations: Secondary | ICD-10-CM | POA: Diagnosis not present

## 2021-12-20 DIAGNOSIS — R9431 Abnormal electrocardiogram [ECG] [EKG]: Secondary | ICD-10-CM | POA: Diagnosis not present

## 2021-12-20 DIAGNOSIS — I517 Cardiomegaly: Secondary | ICD-10-CM

## 2021-12-20 MED ORDER — DILTIAZEM HCL ER COATED BEADS 180 MG PO CP24: 180.0000 mg | ORAL_CAPSULE | Freq: Every day | ORAL | 3 refills | Status: DC

## 2021-12-20 NOTE — Patient Instructions (Addendum)
Medication Instructions:  Your physician has recommended you make the following change in your medication:  TAPER: Lopressor 25 mg twice tomorrow, Saturday take 25 mg once, Sunday take 12.5 mg then stop.  START: Cardizem 180 mg daily (Start on Sunday)  *If you need a refill on your cardiac medications before your next appointment, please call your pharmacy*   Lab Work: None If you have labs (blood work) drawn today and your tests are completely normal, you will receive your results only by: MyChart Message (if you have MyChart) OR A paper copy in the mail If you have any lab test that is abnormal or we need to change your treatment, we will call you to review the results.   Testing/Procedures: Your physician has requested that you have an echocardiogram. Echocardiography is a painless test that uses sound waves to create images of your heart. It provides your doctor with information about the size and shape of your heart and how well your hearts chambers and valves are working. This procedure takes approximately one hour. There are no restrictions for this procedure.  ZIO XT- Long Term Monitor Instructions  Your physician has requested you wear a ZIO patch monitor for 14 days.  This is a single patch monitor. [employer] supplies one patch monitor per enrollment. Additional stickers are not available. Please do not apply patch if you will be having a Nuclear Stress Test,  Echocardiogram, Cardiac CT, MRI, or Chest Xray during the period you would be wearing the  monitor. The patch cannot be worn during these tests. You cannot remove and re-apply the  ZIO XT patch monitor.  Your ZIO patch monitor will be mailed 3 day USPS to your address on file. It may take 3-5 days  to receive your monitor after you have been enrolled.  Once you have received your monitor, please review the enclosed instructions. Your monitor  has already been registered assigning a specific monitor serial # to  you.  Billing and Patient Assistance Program Information  We have supplied [employer] with any of your insurance information on file for billing purposes. [employer] offers a sliding scale Patient Assistance Program for patients that do not have  insurance, or whose insurance does not completely cover the cost of the ZIO monitor.  You must apply for the Patient Assistance Program to qualify for this discounted rate.  To apply, please call [employer] at [phone], select option 4, select option 2, ask to apply for  Patient Assistance Program. Deanna Mcfarland will ask your household income, and how many people  are in your household. They will quote your out-of-pocket cost based on that information.  [employer] will also be able to set up a 52-month, interest-free payment plan if needed.  Applying the monitor   Shave hair from upper left chest.  Hold abrader disc by orange tab. Rub abrader in 40 strokes over the upper left chest as  indicated in your monitor instructions.  Clean area with 4 enclosed alcohol pads. Let dry.  Apply patch as indicated in monitor instructions. Patch will be placed under collarbone on left  side of chest with arrow pointing upward.  Rub patch adhesive wings for 2 minutes. Remove white label marked "1". Remove the white  label marked "2". Rub patch adhesive wings for 2 additional minutes.  While looking in a mirror, press and release button in center of patch. A small green light will  flash 3-4 times. This will be your only indicator that the monitor has been turned  on.  Do not shower for the first 24 hours. You may shower after the first 24 hours.  Press the button if you feel a symptom. You will hear a small click. Record Date, Time and  Symptom in the Patient Logbook.  When you are ready to remove the patch, follow instructions on the last 2 pages of Patient  Logbook. Stick patch monitor onto the last page of Patient Logbook.  Place Patient Logbook in the blue and white box.  Use locking tab on box and tape box closed  securely. The blue and white box has prepaid postage on it. Please place it in the mailbox as  soon as possible. Your physician should have your test results approximately 7 days after the  monitor has been mailed back to [facility].  Call [facility] Customer Care at [phone] if you have questions regarding  your ZIO XT patch monitor. Call them immediately if you see an orange light blinking on your  monitor.  If your monitor falls off in less than 4 days, contact our Monitor department at [phone].  If your monitor becomes loose or falls off after 4 days call [employer] at [phone] for  suggestions on securing your monitor    Follow-Up: At [facility], you and your health needs are our priority.  As part of our continuing mission to provide you with exceptional heart care, we have created designated Provider Care Teams.  These Care Teams include your primary Cardiologist (physician) and Advanced Practice Providers (APPs -  Physician Assistants and Nurse Practitioners) who all work together to provide you with the care you need, when you need it.  We recommend signing up for the patient portal called "MyChart".  Sign up information is provided on this After Visit Summary.  MyChart is used to connect with patients for Virtual Visits (Telemedicine).  Patients are able to view lab/test results, encounter notes, upcoming appointments, etc.  Non-urgent messages can be sent to your provider as well.   To learn more about what you can do with MyChart, go to [email or web address].    Your next appointment:   12 week(s)  The format for your next appointment:   In Person  Provider:   Thomasene Ripple, DO     Other Instructions

## 2021-12-20 NOTE — Progress Notes (Signed)
Cardiology Office Note:    Date:  12/20/2021   ID:  Deanna Mcfarland, DOB 02-Dec-1961, MRN [id number]  PCP:  Harlan Stains, MD  Cardiologist:  Berniece Salines, DO  Electrophysiologist:  None   Referring MD: Harlan Stains, MD   " I am having palpitations"  History of Present Illness:    Deanna Mcfarland is a 60 y.o. female with a hx of hypertension, COVID-19 infection presents to be evaluated for palpitations. The patient tells me for the last several months Back to [employer].  She has had intermittent palpitations.  She describes this as abrupt onset of fast heartbeat.  She has been started on beta-blocker which has not really helped.  She tells me the palpitations are mostly daily.  She is concerned about this.  She has not had any chest discomfort, shortness of breath.  Past Medical History:  Diagnosis Date   Hypertension     Past Surgical History:  Procedure Laterality Date   TUBAL LIGATION      Current Medications: Current Meds  Medication Sig   ascorbic acid (VITAMIN C) 1000 MG tablet Take 1,000 mg by mouth daily.   Calcium Carbonate (CALCIUM 500 PO) Take by mouth.   Cetirizine HCl (ZYRTEC ALLERGY) 10 MG CAPS Zyrtec   cholecalciferol (VITAMIN D3) 25 MCG (1000 UNIT) tablet Take 1,000 Units by mouth daily.   diltiazem (CARDIZEM CD) 180 MG 24 hr capsule Take 1 capsule (180 mg total) by mouth daily.   fluticasone (FLONASE) 50 MCG/ACT nasal spray Place 2 sprays into both nostrils daily as needed for allergies.    LORazepam (ATIVAN) 0.5 MG tablet Take 0.25-0.5 mg by mouth 3 (three) times daily as needed.   metoprolol tartrate (LOPRESSOR) 50 MG tablet Take 50 mg by mouth 2 (two) times daily.   Misc Natural Products (TURMERIC CURCUMIN) CAPS See admin instructions.   Multiple Vitamin (MULTIVITAMIN) tablet Take 1 tablet by mouth daily.   Zinc Sulfate (ZINC 15 PO) Take by mouth.     Allergies:   Amlodipine   Social History   Socioeconomic History   Marital status: Married     Spouse name: Not on file   Number of children: Not on file   Years of education: Not on file   Highest education level: Not on file  Occupational History   Not on file  Tobacco Use   Smoking status: Never   Smokeless tobacco: Never  Substance and Sexual Activity   Alcohol use: Yes    Comment: occasional wine   Drug use: No   Sexual activity: Not on file  Other Topics Concern   Not on file  Social History Narrative   Not on file   Social Determinants of Health   Financial Resource Strain: Not on file  Food Insecurity: Not on file  Transportation Needs: Not on file  Physical Activity: Not on file  Stress: Not on file  Social Connections: Not on file     Family History: The patient's family history includes Hypertension in her father and mother; Thyroid disease in her mother.  ROS:   Review of Systems  Constitution: Negative for decreased appetite, fever and weight gain.  HENT: Negative for congestion, ear discharge, hoarse voice and sore throat.   Eyes: Negative for discharge, redness, vision loss in right eye and visual halos.  Cardiovascular: Report palpitations.  Negative for chest pain, dyspnea on exertion, leg swelling, orthopnea. Respiratory: Negative for cough, hemoptysis, shortness of breath and snoring.   Endocrine: Negative  for heat intolerance and polyphagia.  Hematologic/Lymphatic: Negative for bleeding problem. Does not bruise/bleed easily.  Skin: Negative for flushing, nail changes, rash and suspicious lesions.  Musculoskeletal: Negative for arthritis, joint pain, muscle cramps, myalgias, neck pain and stiffness.  Gastrointestinal: Negative for abdominal pain, bowel incontinence, diarrhea and excessive appetite.  Genitourinary: Negative for decreased libido, genital sores and incomplete emptying.  Neurological: Negative for brief paralysis, focal weakness, headaches and loss of balance.  Psychiatric/Behavioral: Negative for altered mental status, depression  and suicidal ideas.  Allergic/Immunologic: Negative for HIV exposure and persistent infections.    EKGs/Labs/Other Studies Reviewed:    The following studies were reviewed today:   EKG:  The ekg ordered today demonstrates normal sinus rhythm, heart rate 64 bpm with P wave morphology suggesting biatrial enlargement.  Recent Labs: No results found for requested labs within last 8760 hours.  Recent Lipid Panel No results found for: CHOL, TRIG, HDL, CHOLHDL, VLDL, LDLCALC, LDLDIRECT  Physical Exam:    VS:  BP 140/90 (BP Location: Right Arm)    Pulse 64    Ht _0  (1.702 m)    Wt 228 lb (103.4 kg)    SpO2 96%    BMI 35.71 kg/m     Wt Readings from Last 3 Encounters:  12/20/21 228 lb (103.4 kg)  10/03/20 199 lb (90.3 kg)  07/01/13 224 lb 4.8 oz (101.7 kg)     GEN: Well nourished, well developed in no acute distress HEENT: Normal NECK: No JVD; No carotid bruits LYMPHATICS: No lymphadenopathy CARDIAC: S1S2 noted,RRR, no murmurs, rubs, gallops RESPIRATORY:  Clear to auscultation without rales, wheezing or rhonchi  ABDOMEN: Soft, non-tender, non-distended, +bowel sounds, no guarding. EXTREMITIES: No edema, No cyanosis, no clubbing MUSCULOSKELETAL:  No deformity  SKIN: Warm and dry NEUROLOGIC:  Alert and oriented x 3, non-focal PSYCHIATRIC:  Normal affect, good insight  ASSESSMENT:    1. Biatrial enlargement   2. Primary hypertension   3. Nonspecific abnormal electrocardiogram (ECG) (EKG)   4. Palpitations    PLAN:    She is expressing significant palpitations despite the use of beta-blocker.  What I am getting.  Please titrate the patient off the beta-blocker start her on Cardizem 180 mg daily.  In addition we will place to monitor the patient to rule out any significant arrhythmia.  Her EKG does show evidence of biatrial enlargement we will get an echocardiogram to assess for any structural abnormalities.  Her blood pressure is not at goal in the office.  I am hoping that  with the transition from medication to Cardizem will be able to get her less than 130/80 mmHg.  The patient understands the need to lose weight with diet and exercise. We have discussed specific strategies for this.  The patient is in agreement with the above plan. The patient left the office in stable condition.  The patient will follow up in 12 weeks.   Medication Adjustments/Labs and Tests Ordered: Current medicines are reviewed at length with the patient today.  Concerns regarding medicines are outlined above.  Orders Placed This Encounter  Procedures   LONG TERM MONITOR (3-14 DAYS)   EKG 12-Lead   ECHOCARDIOGRAM COMPLETE   Meds ordered this encounter  Medications   diltiazem (CARDIZEM CD) 180 MG 24 hr capsule    Sig: Take 1 capsule (180 mg total) by mouth daily.    Dispense:  90 capsule    Refill:  3    Patient Instructions  Medication Instructions:  Your physician has  recommended you make the following change in your medication:  TAPER: Lopressor 25 mg twice tomorrow, Saturday take 25 mg once, Sunday take 12.5 mg then stop.  START: Cardizem 180 mg daily (Start on Sunday)  *If you need a refill on your cardiac medications before your next appointment, please call your pharmacy*   Lab Work: None If you have labs (blood work) drawn today and your tests are completely normal, you will receive your results only by: [facility] (if you have MyChart) OR A paper copy in the mail If you have any lab test that is abnormal or we need to change your treatment, we will call you to review the results.   Testing/Procedures: Your physician has requested that you have an echocardiogram. Echocardiography is a painless test that uses sound waves to create images of your heart. It provides your doctor with information about the size and shape of your heart and how well your hearts chambers and valves are working. This procedure takes approximately one hour. There are no restrictions  for this procedure.  ZIO XT- Long Term Monitor Instructions  Your physician has requested you wear a ZIO patch monitor for 14 days.  This is a single patch monitor. [employer] supplies one patch monitor per enrollment. Additional stickers are not available. Please do not apply patch if you will be having a Nuclear Stress Test,  Echocardiogram, Cardiac CT, MRI, or Chest Xray during the period you would be wearing the  monitor. The patch cannot be worn during these tests. You cannot remove and re-apply the  ZIO XT patch monitor.  Your ZIO patch monitor will be mailed 3 day USPS to your address on file. It may take 3-5 days  to receive your monitor after you have been enrolled.  Once you have received your monitor, please review the enclosed instructions. Your monitor  has already been registered assigning a specific monitor serial # to you.  Billing and Patient Assistance Program Information  We have supplied [employer] with any of your insurance information on file for billing purposes. [employer] offers a sliding scale Patient Assistance Program for patients that do not have  insurance, or whose insurance does not completely cover the cost of the ZIO monitor.  You must apply for the Patient Assistance Program to qualify for this discounted rate.  To apply, please call [employer] at [phone], select option 4, select option 2, ask to apply for  Patient Assistance Program. Theodore Demark will ask your household income, and how many people  are in your household. They will quote your out-of-pocket cost based on that information.  [employer] will also be able to set up a 68-month interest-free payment plan if needed.  Applying the monitor   Shave hair from upper left chest.  Hold abrader disc by orange tab. Rub abrader in 40 strokes over the upper left chest as  indicated in your monitor instructions.  Clean area with 4 enclosed alcohol pads. Let dry.  Apply patch as indicated in monitor instructions.  Patch will be placed under collarbone on left  side of chest with arrow pointing upward.  Rub patch adhesive wings for 2 minutes. Remove white label marked "1". Remove the white  label marked "2". Rub patch adhesive wings for 2 additional minutes.  While looking in a mirror, press and release button in center of patch. A small green light will  flash 3-4 times. This will be your only indicator that the monitor has been turned on.  Do not shower  for the first 24 hours. You may shower after the first 24 hours.  Press the button if you feel a symptom. You will hear a small click. Record Date, Time and  Symptom in the Patient Logbook.  When you are ready to remove the patch, follow instructions on the last 2 pages of Patient  Logbook. Stick patch monitor onto the last page of Patient Logbook.  Place Patient Logbook in the blue and white box. Use locking tab on box and tape box closed  securely. The blue and white box has prepaid postage on it. Please place it in the mailbox as  soon as possible. Your physician should have your test results approximately 7 days after the  monitor has been mailed back to [facility].  Call [facility] at [phone] if you have questions regarding  your ZIO XT patch monitor. Call them immediately if you see an orange light blinking on your  monitor.  If your monitor falls off in less than 4 days, contact our Monitor department at [phone].  If your monitor becomes loose or falls off after 4 days call [employer] at [phone] for  suggestions on securing your monitor    Follow-Up: At [facility], you and your health needs are our priority.  As part of our continuing mission to provide you with exceptional heart care, we have created designated Provider Care Teams.  These Care Teams include your primary Cardiologist (physician) and Advanced Practice Providers (APPs -  Physician Assistants and Nurse Practitioners) who all work together  to provide you with the care you need, when you need it.  We recommend signing up for the patient portal called "MyChart".  Sign up information is provided on this After Visit Summary.  MyChart is used to connect with patients for Virtual Visits (Telemedicine).  Patients are able to view lab/test results, encounter notes, upcoming appointments, etc.  Non-urgent messages can be sent to your provider as well.   To learn more about what you can do with MyChart, go to [email or web address].    Your next appointment:   12 week(s)  The format for your next appointment:   In Person  Provider:   Berniece Salines, DO     Other Instructions     Adopting a Healthy Lifestyle.  Know what a healthy weight is for you (roughly BMI <25) and aim to maintain this   Aim for 7+ servings of fruits and vegetables daily   65-80+ fluid ounces of water or unsweet tea for healthy kidneys   Limit to max 1 drink of alcohol per day; avoid smoking/tobacco   Limit animal fats in diet for cholesterol and heart health - choose grass fed whenever available   Avoid highly processed foods, and foods high in saturated/trans fats   Aim for low stress - take time to unwind and care for your mental health   Aim for 150 min of moderate intensity exercise weekly for heart health, and weights twice weekly for bone health   Aim for 7-9 hours of sleep daily   When it comes to diets, agreement about the perfect plan isnt easy to find, even among the experts. Experts at the [employer] developed an idea known as the Healthy Eating Plate. Just imagine a plate divided into logical, healthy portions.   The emphasis is on diet quality:   Load up on vegetables and fruits - one-half of your plate: Aim for color and variety, and remember that potatoes dont  count.   Go for whole grains - one-quarter of your plate: Whole wheat, barley, wheat berries, quinoa, oats, brown rice, and foods made with them. If you  want pasta, go with whole wheat pasta.   Protein power - one-quarter of your plate: Fish, chicken, beans, and nuts are all healthy, versatile protein sources. Limit red meat.   The diet, however, does go beyond the plate, offering a few other suggestions.   Use healthy plant oils, such as olive, canola, soy, corn, sunflower and peanut. Check the labels, and avoid partially hydrogenated oil, which have unhealthy trans fats.   If youre thirsty, drink water. Coffee and tea are good in moderation, but skip sugary drinks and limit milk and dairy products to one or two daily servings.   The type of carbohydrate in the diet is more important than the amount. Some sources of carbohydrates, such as vegetables, fruits, whole grains, and beans-are healthier than others.   Finally, stay active  Signed, Berniece Salines, DO  12/20/2021 10:03 AM    [place]

## 2021-12-20 NOTE — Progress Notes (Unsigned)
Enrolled patient for a 14 day Zio XT  monitor to be mailed to patients home  °

## 2021-12-25 ENCOUNTER — Other Ambulatory Visit (HOSPITAL_COMMUNITY): Payer: 59

## 2021-12-25 ENCOUNTER — Encounter (HOSPITAL_COMMUNITY): Payer: Self-pay | Admitting: Cardiology

## 2022-01-01 ENCOUNTER — Ambulatory Visit (HOSPITAL_COMMUNITY)
Admission: RE | Admit: 2022-01-01 | Discharge: 2022-01-01 | Disposition: A | Discharge status: Home / Self Care | Payer: 59 | Source: Ambulatory Visit | Attending: Cardiology | Admitting: Cardiology

## 2022-01-01 ENCOUNTER — Other Ambulatory Visit: Payer: Self-pay

## 2022-01-01 DIAGNOSIS — I10 Essential (primary) hypertension: Secondary | ICD-10-CM | POA: Insufficient documentation

## 2022-01-01 DIAGNOSIS — R9431 Abnormal electrocardiogram [ECG] [EKG]: Secondary | ICD-10-CM | POA: Diagnosis not present

## 2022-01-01 LAB — ECHOCARDIOGRAM COMPLETE
Area-P 1/2: 4.06 cm2
Calc EF: 66.8 %
S' Lateral: 2.9 cm
Single Plane A2C EF: 70.4 %
Single Plane A4C EF: 63.7 %

## 2022-03-13 ENCOUNTER — Encounter: Payer: Self-pay | Admitting: Cardiology

## 2022-03-13 ENCOUNTER — Ambulatory Visit: Payer: 59 | Admitting: Cardiology

## 2022-03-13 ENCOUNTER — Other Ambulatory Visit: Payer: Self-pay

## 2022-03-13 VITALS — BP 152/90 | HR 86 | Ht 67.0 in | Wt 228.8 lb

## 2022-03-13 DIAGNOSIS — I10 Essential (primary) hypertension: Secondary | ICD-10-CM

## 2022-03-13 DIAGNOSIS — E669 Obesity, unspecified: Secondary | ICD-10-CM | POA: Diagnosis not present

## 2022-03-13 MED ORDER — HYDROCHLOROTHIAZIDE 12.5 MG PO CAPS: 12.5000 mg | ORAL_CAPSULE | Freq: Every day | ORAL | 3 refills | Status: DC

## 2022-03-13 NOTE — Progress Notes (Signed)
?Cardiology Office Note:   ? ?Date:  03/13/2022  ? ?ID:  Deanna CoombesEvelyn A Rufo, DOB 11/27/1961, MRN [id number] ? ?PCP:  Laurann MontanaWhite, Cynthia, MD  ?Cardiologist:  Thomasene RippleKardie Krisalyn Yankowski, DO  ?Electrophysiologist:  None  ? ?Referring MD: Laurann MontanaWhite, Cynthia, MD  ? ?" I am doing well but my blood pressure has been high"  ? ?History of Present Illness:   ? ?Deanna Mcfarland is a 61 y.o. female with a hx of  Covid-19 infection, hypertension here for follow-up visit.  I first saw the patient December 2022 at that time she was experiencing shortness of breath and significant palpitations.  She was also hypertensive.  I stopped the beta-blocker and started the patient on Cardizem 180 mg today.  In addition to the place a monitor the patient as well as got an echocardiogram. ? ?She did get her monitor and her echocardiogram. ? ?Past Medical History:  ?Diagnosis Date  ? Hypertension   ? ? ?Past Surgical History:  ?Procedure Laterality Date  ? TUBAL LIGATION    ? ? ?Current Medications: ?Current Meds  ?Medication Sig  ? ascorbic acid (VITAMIN C) 1000 MG tablet Take 1,000 mg by mouth daily.  ? Calcium Carbonate (CALCIUM 500 PO) Take by mouth.  ? Cetirizine HCl (ZYRTEC ALLERGY) 10 MG CAPS Zyrtec  ? cholecalciferol (VITAMIN D3) 25 MCG (1000 UNIT) tablet Take 1,000 Units by mouth daily.  ? diltiazem (CARDIZEM CD) 180 MG 24 hr capsule Take 1 capsule (180 mg total) by mouth daily.  ? fluticasone (FLONASE) 50 MCG/ACT nasal spray Place 2 sprays into both nostrils daily as needed for allergies.   ? hydrochlorothiazide (MICROZIDE) 12.5 MG capsule Take 1 capsule (12.5 mg total) by mouth daily.  ? LORazepam (ATIVAN) 0.5 MG tablet Take 0.25-0.5 mg by mouth 3 (three) times daily as needed.  ? Misc Natural Products (TURMERIC CURCUMIN) CAPS See admin instructions.  ? Multiple Vitamin (MULTIVITAMIN) tablet Take 1 tablet by mouth daily.  ? Zinc Sulfate (ZINC 15 PO) Take by mouth.  ?  ? ?Allergies:   Amlodipine  ? ?Social History  ? ?Socioeconomic History  ? Marital status:  Married  ?  Spouse name: Not on file  ? Number of children: Not on file  ? Years of education: Not on file  ? Highest education level: Not on file  ?Occupational History  ? Not on file  ?Tobacco Use  ? Smoking status: Never  ? Smokeless tobacco: Never  ?Substance and Sexual Activity  ? Alcohol use: Yes  ?  Comment: occasional wine  ? Drug use: No  ? Sexual activity: Not on file  ?Other Topics Concern  ? Not on file  ?Social History Narrative  ? Not on file  ? ?Social Determinants of Health  ? ?Financial Resource Strain: Not on file  ?Food Insecurity: Not on file  ?Transportation Needs: Not on file  ?Physical Activity: Not on file  ?Stress: Not on file  ?Social Connections: Not on file  ?  ? ?Family History: ?The patient's family history includes Hypertension in her father and mother; Thyroid disease in her mother. ? ?ROS:   ?Review of Systems  ?Constitution: Negative for decreased appetite, fever and weight gain.  ?HENT: Negative for congestion, ear discharge, hoarse voice and sore throat.   ?Eyes: Negative for discharge, redness, vision loss in right eye and visual halos.  ?Cardiovascular: Negative for chest pain, dyspnea on exertion, leg swelling, orthopnea and palpitations.  ?Respiratory: Negative for cough, hemoptysis, shortness of breath and snoring.   ?  Endocrine: Negative for heat intolerance and polyphagia.  ?Hematologic/Lymphatic: Negative for bleeding problem. Does not bruise/bleed easily.  ?Skin: Negative for flushing, nail changes, rash and suspicious lesions.  ?Musculoskeletal: Negative for arthritis, joint pain, muscle cramps, myalgias, neck pain and stiffness.  ?Gastrointestinal: Negative for abdominal pain, bowel incontinence, diarrhea and excessive appetite.  ?Genitourinary: Negative for decreased libido, genital sores and incomplete emptying.  ?Neurological: Negative for brief paralysis, focal weakness, headaches and loss of balance.  ?Psychiatric/Behavioral: Negative for altered mental status,  depression and suicidal ideas.  ?Allergic/Immunologic: Negative for HIV exposure and persistent infections.  ? ? ?EKGs/Labs/Other Studies Reviewed:   ? ?The following studies were reviewed today: ? ? ?EKG: None today ? ?TTE 01/01/2022 ?IMPRESSIONS  ? ? ? 1. Left ventricular ejection fraction, by estimation, is 65 to 70%. The  ?left ventricle has normal function. The left ventricle has no regional  ?wall motion abnormalities. Left ventricular diastolic parameters were  ?normal.  ? 2. Right ventricular systolic function is low normal. The right  ?ventricular size is mildly enlarged.  ? 3. Right atrial size was mildly dilated.  ? 4. The mitral valve is normal in structure. Trivial mitral valve  ?regurgitation.  ? 5. The aortic valve is tricuspid. Aortic valve regurgitation is not  ?visualized.  ? 6. The inferior vena cava is normal in size with greater than 50%  ?respiratory variability, suggesting right atrial pressure of 3 mmHg.  ? ?FINDINGS  ? Left Ventricle: Left ventricular ejection fraction, by estimation, is 65  ?to 70%. The left ventricle has normal function. The left ventricle has no  ?regional wall motion abnormalities. The left ventricular internal cavity  ?size was normal in size. There is  ? no left ventricular hypertrophy. Left ventricular diastolic parameters  ?were normal.  ? ?Right Ventricle: The right ventricular size is mildly enlarged. Right  ?vetricular wall thickness was not assessed. Right ventricular systolic  ?function is low normal.  ? ?Left Atrium: Left atrial size was normal in size.  ? ?Right Atrium: Right atrial size was mildly dilated.  ? ?Pericardium: There is no evidence of pericardial effusion.  ? ?Mitral Valve: The mitral valve is normal in structure. Trivial mitral  ?valve regurgitation.  ? ?Tricuspid Valve: The tricuspid valve is normal in structure. Tricuspid  ?valve regurgitation is mild.  ? ?Aortic Valve: The aortic valve is tricuspid. Aortic valve regurgitation is  ?not  visualized.  ? ?ZIO monitor 01/11/2022 ?Patch Wear Time:  14 days and 0 hours (2022-12-29T11:06:54-0500 to 2023-01-12T11:06:58-0500) ?  ?Patient had a min HR of 47 bpm, max HR of 158 bpm, and avg HR of 69 bpm. Predominant underlying rhythm was Sinus Rhythm. Slight P wave morphology changes were noted. Isolated SVEs were rare (<1.0%), SVE Couplets were rare (<1.0%), and SVE Triplets were  ?rare (<1.0%). Isolated VEs were rare (<1.0%), and no VE Couplets or VE Triplets were present. ?  ?Symptoms associated with sinus bradycardia and sinus rhythm. ?  ?Conclusion: Unremarkable study with no evidence of significant arrhythmia. ? ?Recent Labs: ?No results found for requested labs within last 8760 hours.  ?Recent Lipid Panel ?No results found for: CHOL, TRIG, HDL, CHOLHDL, VLDL, LDLCALC, LDLDIRECT ? ?Physical Exam:   ? ?VS:  BP (!) 152/90   Pulse 86   Ht 5\' 7"  (1.702 m)   Wt 228 lb 12.8 oz (103.8 kg)   SpO2 97%   BMI 35.84 kg/m?    ? ?Wt Readings from Last 3 Encounters:  ?03/13/22 228 lb 12.8 oz (103.8  kg)  ?12/20/21 228 lb (103.4 kg)  ?10/03/20 199 lb (90.3 kg)  ?  ? ?GEN: Well nourished, well developed in no acute distress ?HEENT: Normal ?NECK: No JVD; No carotid bruits ?LYMPHATICS: No lymphadenopathy ?CARDIAC: S1S2 noted,RRR, no murmurs, rubs, gallops ?RESPIRATORY:  Clear to auscultation without rales, wheezing or rhonchi  ?ABDOMEN: Soft, non-tender, non-distended, +bowel sounds, no guarding. ?EXTREMITIES: No edema, No cyanosis, no clubbing ?MUSCULOSKELETAL:  No deformity  ?SKIN: Warm and dry ?NEUROLOGIC:  Alert and oriented x 3, non-focal ?PSYCHIATRIC:  Normal affect, good insight ? ?ASSESSMENT:   ? ?1. Primary hypertension   ?2. Obesity (BMI 30-39.9)   ? ?PLAN:   ? ?She is hypertensive in the office today.  We will start the patient on hydrochlorothiazide 12.5 mg daily.  Continue her Cardizem. ? ?The patient understands the need to lose weight with diet and exercise. We have discussed specific strategies for  this. ? ?The patient is in agreement with the above plan. The patient left the office in stable condition.  The patient will follow up in ? ? ?Medication Adjustments/Labs and Tests Ordered: ?Current medicines are reviewed at

## 2022-03-13 NOTE — Patient Instructions (Addendum)
Medication Instructions:  ?Your physician has recommended you make the following change in your medication:  ?START: Hydrochlorothiazide 12.5 mg once daily ? ?Please take your blood pressure daily for 1 weeks and send in a MyChart message. Please include heart rates.  ? ?*If you need a refill on your cardiac medications before your next appointment, please call your pharmacy* ? ? ?Lab Work: ?None ?If you have labs (blood work) drawn today and your tests are completely normal, you will receive your results only by: ?MyChart Message (if you have MyChart) OR ?A paper copy in the mail ?If you have any lab test that is abnormal or we need to change your treatment, we will call you to review the results. ? ? ?Testing/Procedures: ?None ? ? ?Follow-Up: ?At [facility], you and your health needs are our priority.  As part of our continuing mission to provide you with exceptional heart care, we have created designated Provider Care Teams.  These Care Teams include your primary Cardiologist (physician) and Advanced Practice Providers (APPs -  Physician Assistants and Nurse Practitioners) who all work together to provide you with the care you need, when you need it. ? ?We recommend signing up for the patient portal called "MyChart".  Sign up information is provided on this After Visit Summary.  MyChart is used to connect with patients for Virtual Visits (Telemedicine).  Patients are able to view lab/test results, encounter notes, upcoming appointments, etc.  Non-urgent messages can be sent to your provider as well.   ?To learn more about what you can do with MyChart, go to [email or web address].   ? ?Your next appointment:   ?6 month(s) ? ?The format for your next appointment:   ?In Person ? ?Provider:   ?Berniece Salines, DO   ? ? ?Other Instructions ?  ?

## 2022-03-26 ENCOUNTER — Encounter: Payer: Self-pay | Admitting: Cardiology

## 2022-04-23 IMAGING — CT CT ABD-PELV W/ CM
2 of 5 series · 12 of 46 positions shown, 14 images · IV contrast (iopamidol)
Comparison: CT abdomen pelvis dated 10/06/2017.

CLINICAL DATA: 59-year-old female with weight loss and loss of
appetite.

EXAM:
CT ABDOMEN AND PELVIS WITH CONTRAST
TECHNIQUE: Multidetector CT imaging of the abdomen and pelvis was performed
using the standard protocol following bolus administration of
intravenous contrast.
CONTRAST:  100mL [id number] IOPAMIDOL ([id number]) INJECTION 61%

[Series 2: abd pelvis 5.00 br40 s3 axial · axial · 0.59mm/px · z∈[+1179,+1499]mm · 9 of 81 slices shown, 11 images]
[im 9/81  soft-tissue]
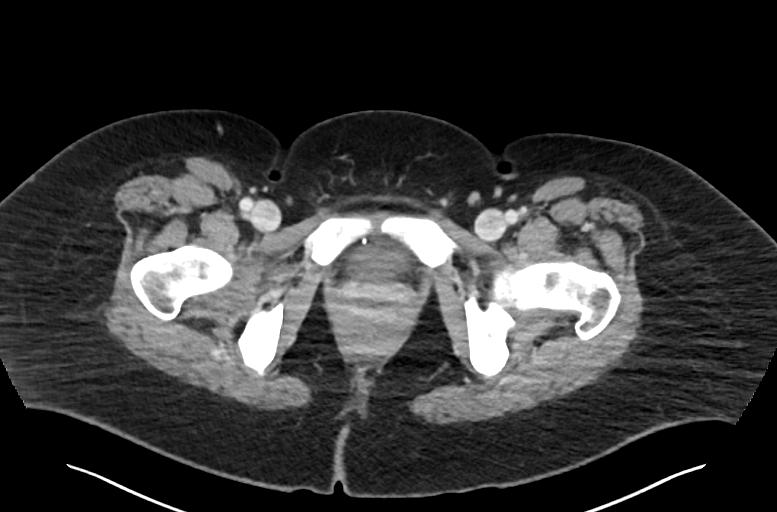
[im 9/81  bone]
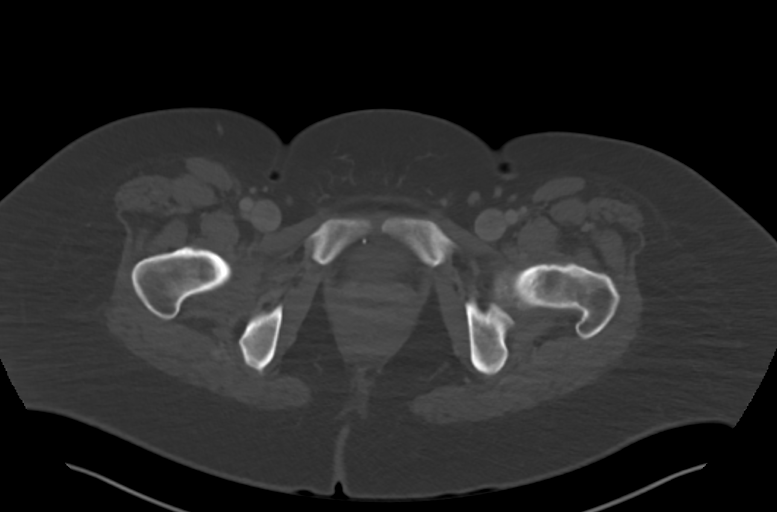
[im 17/81  soft-tissue]
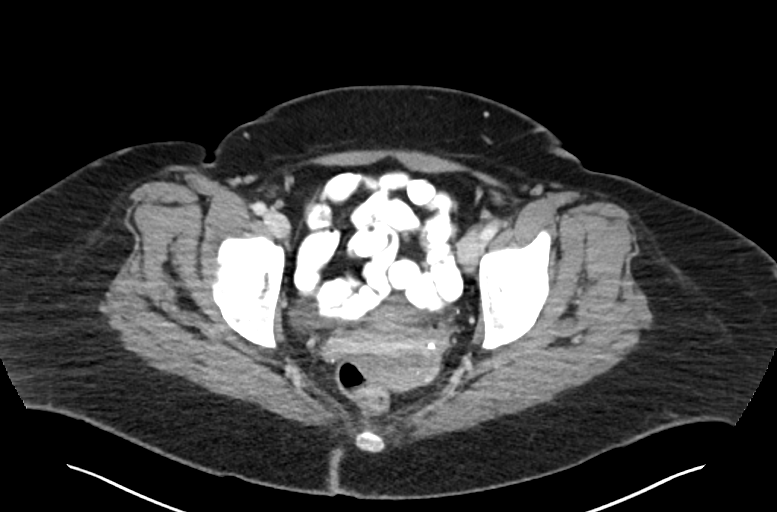
[im 25/81  soft-tissue]
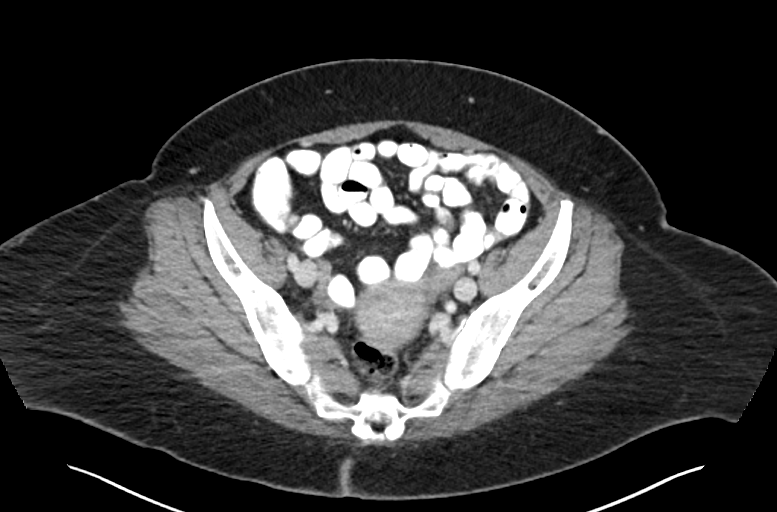
[im 33/81  soft-tissue]
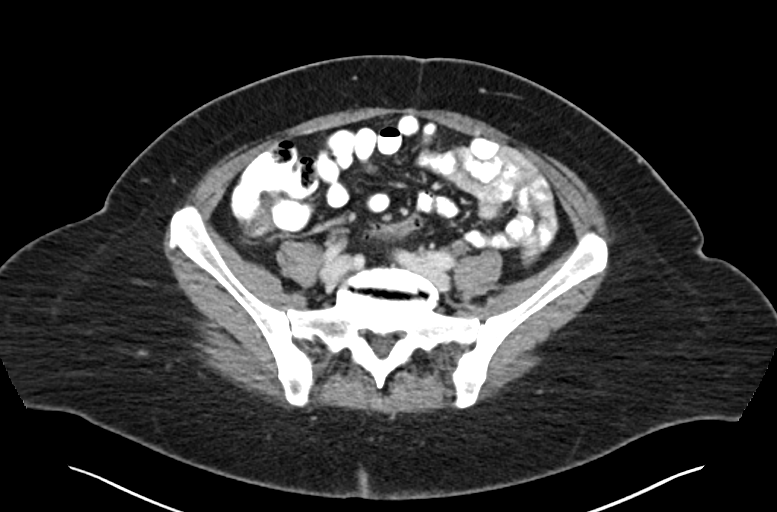
[im 41/81  soft-tissue]
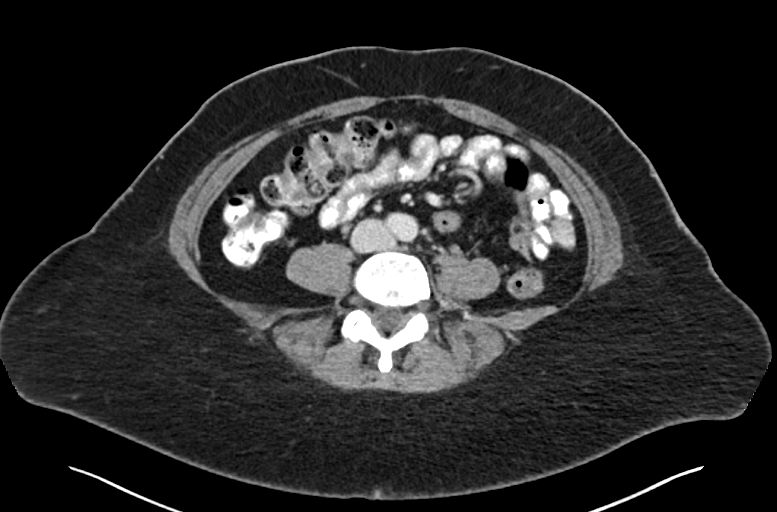
[im 49/81  soft-tissue]
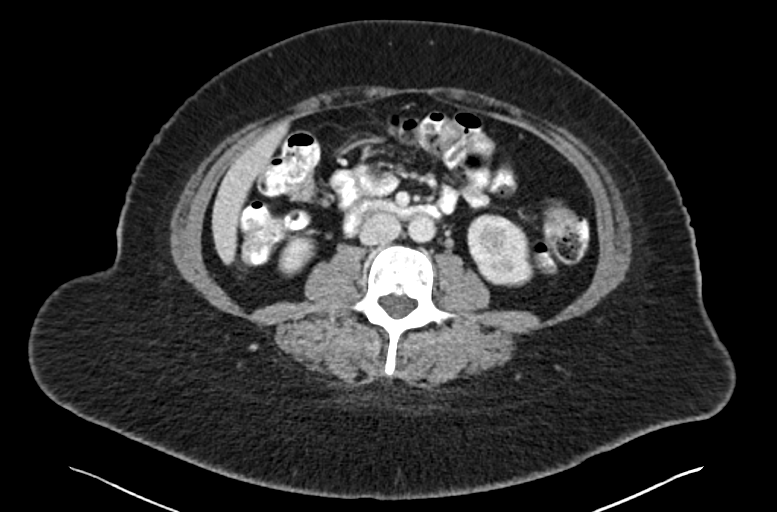
[im 57/81  soft-tissue]
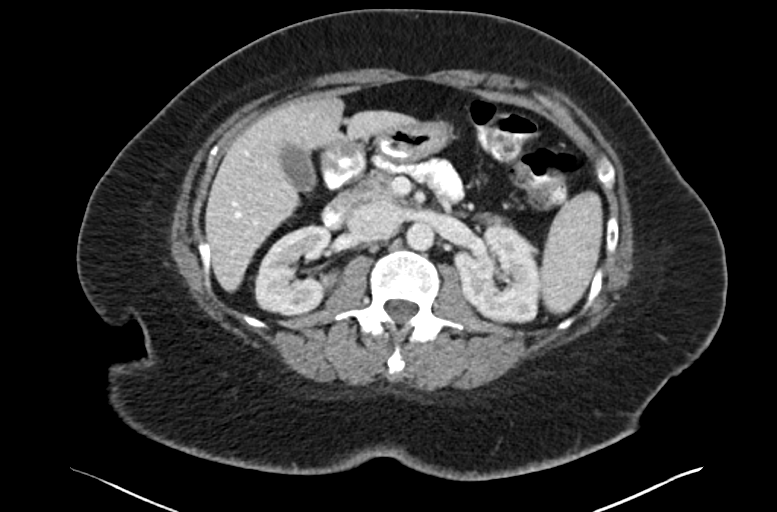
[im 65/81  soft-tissue]
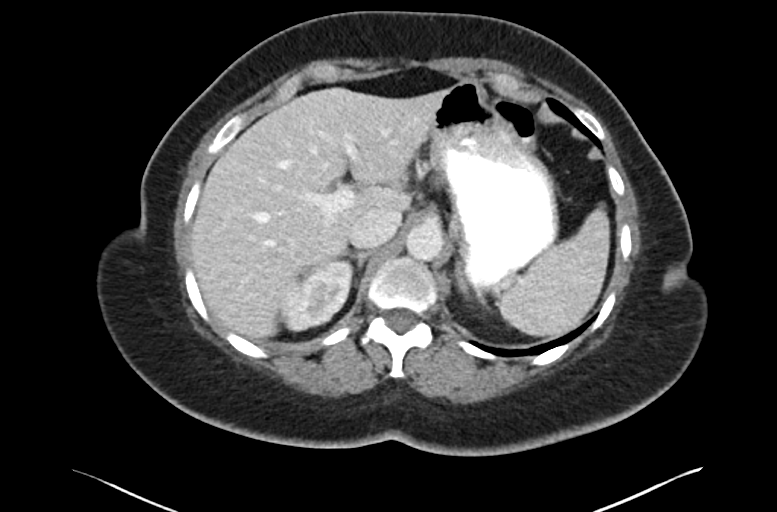
[im 73/81  soft-tissue]
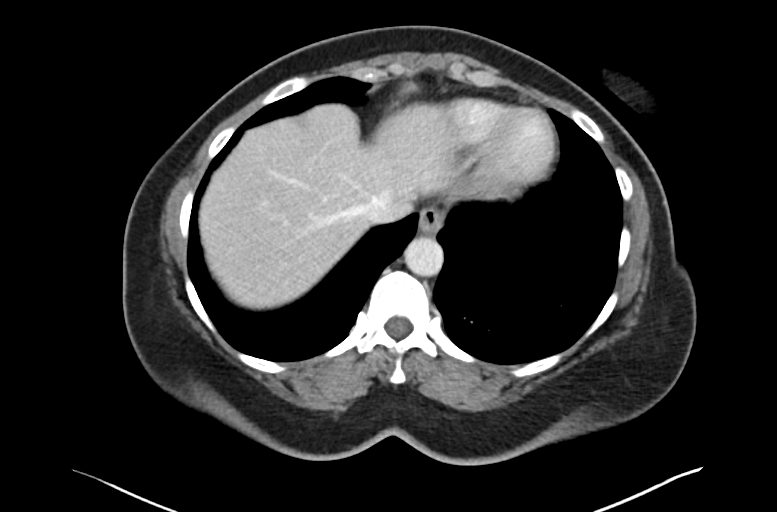
[im 73/81  bone]
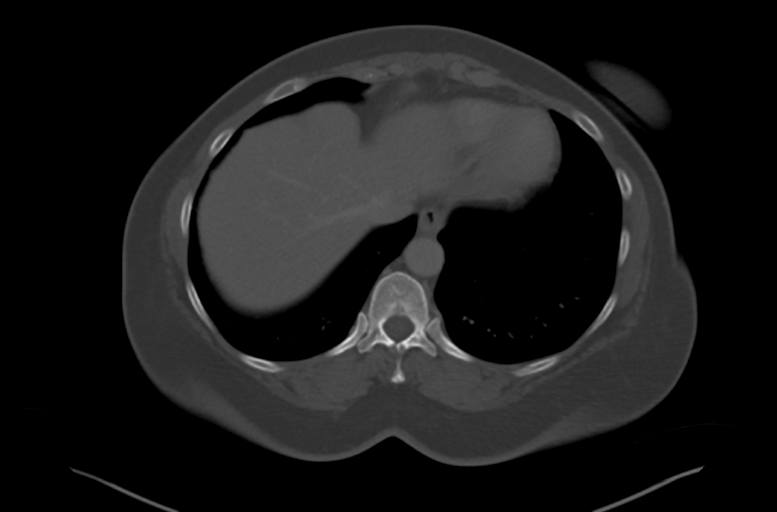

[Series 6: abd pelvis 2.00 br40 s3 cor · coronal · 0.80mm/px · 3 of 150 slices shown]
[im 50/150  soft-tissue]
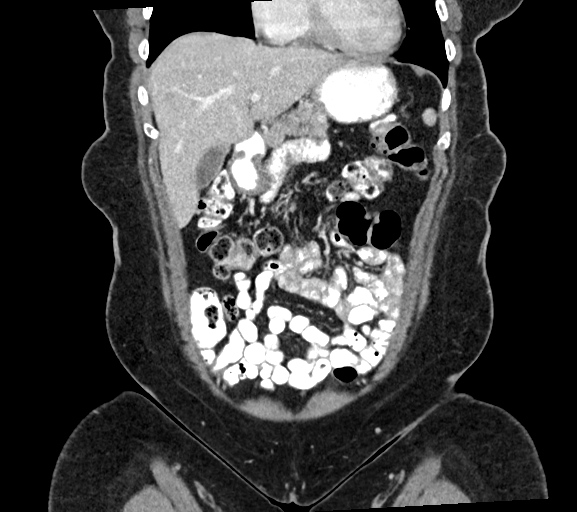
[im 67/150  soft-tissue]
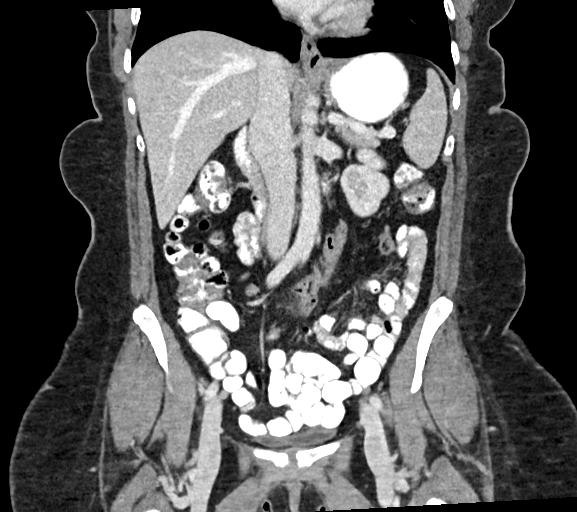
[im 83/150  soft-tissue]
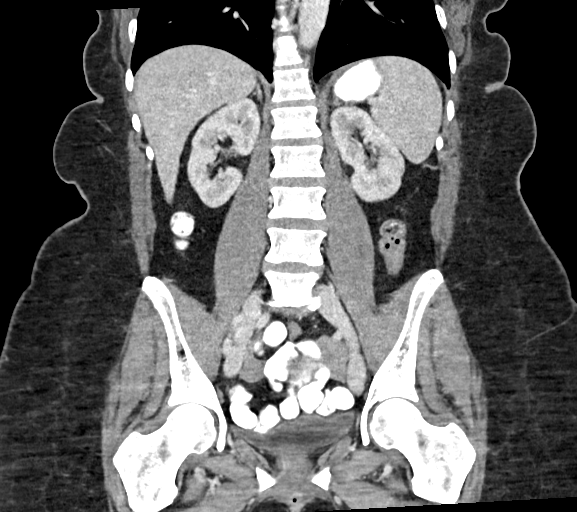

[12 of 46 positions shown; findings below may reference images not displayed]

FINDINGS: Lower chest: The visualized lung bases are clear.

No intra-abdominal free air or free fluid.

Hepatobiliary: No focal liver abnormality is seen. No gallstones,
gallbladder wall thickening, or biliary dilatation.

Pancreas: Unremarkable. No pancreatic ductal dilatation or
surrounding inflammatory changes.

Spleen: Normal in size without focal abnormality.

Adrenals/Urinary Tract: The adrenal glands unremarkable. There is no
hydronephrosis on either side. There is symmetric enhancement and
excretion of contrast by both kidneys. The visualized ureters and
urinary bladder appear unremarkable.

Stomach/Bowel: There is no bowel obstruction or active inflammation.
The appendix is normal.

Vascular/Lymphatic: The abdominal aorta and IVC are unremarkable. No
portal venous gas. There is no adenopathy.

Reproductive: The uterus is anteverted. No adnexal masses.

Other: Small fat containing umbilical hernia

Musculoskeletal: Degenerative changes primarily at L5-S1 with disc
desiccation and vacuum phenomena. No acute osseous pathology.
IMPRESSION: No acute intra-abdominal or pelvic pathology.

## 2022-05-17 IMAGING — MR MR HEAD WO/W CM
12 series · 48 of 48 positions shown · IV contrast (multihance)
Comparison: Head CT 07/16/2015

CLINICAL DATA: Weight loss. Loss of smell. History of JYZOK-EX
infection.

EXAM:
MRI HEAD WITHOUT AND WITH CONTRAST
TECHNIQUE: Multiplanar, multiecho pulse sequences of the brain and surrounding
structures were obtained without and with intravenous contrast.
CONTRAST:  19mL MULTIHANCE GADOBENATE DIMEGLUMINE 529 MG/ML IV SOLN

[Series 5: T1 · sagittal · 4.0mm · 0.75mm/px · 1 of 31 slices shown (1 of 3)]
[im 1/31]
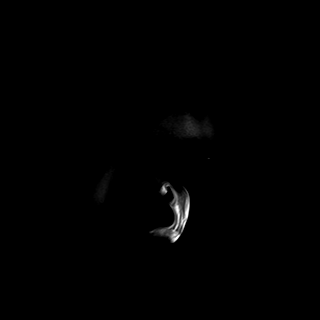

[Series 6: DWI · axial · 3.0mm · 1.44mm/px · z∈[-79,+54]mm · 4 of 84 slices shown (1 of 4)]
[im 1/84]
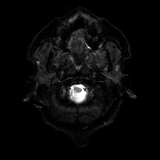
[im 28/84]
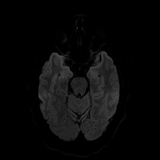
[im 56/84]
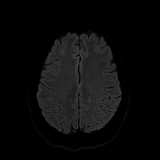
[im 84/84]
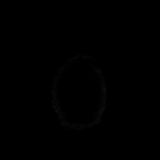

[Series 7: DWI · axial · 3.0mm · 1.44mm/px · z∈[-79,+54]mm · 3 of 42 slices shown (2 of 4)]
[im 1/42]
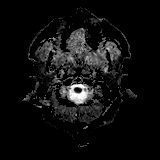
[im 21/42]
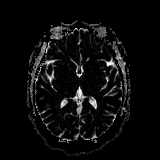
[im 42/42]
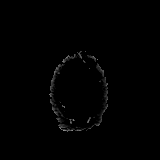

[Series 8: DWI · coronal · 5.0mm · 1.44mm/px · 4 of 60 slices shown (3 of 4)]
[im 1/60]
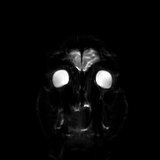
[im 20/60]
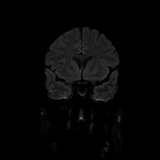
[im 40/60]
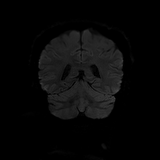
[im 60/60]
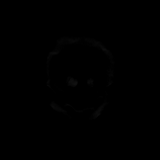

[Series 9: DWI · coronal · 5.0mm · 1.44mm/px · 2 of 30 slices shown (4 of 4)]
[im 1/30]
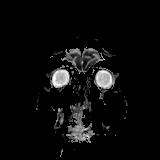
[im 30/30]
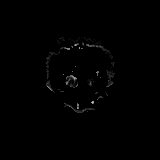

[Series 10: T2 · axial · 4.0mm · 0.36mm/px · z∈[-81,+53]mm · 2 of 27 slices shown]
[im 1/27]
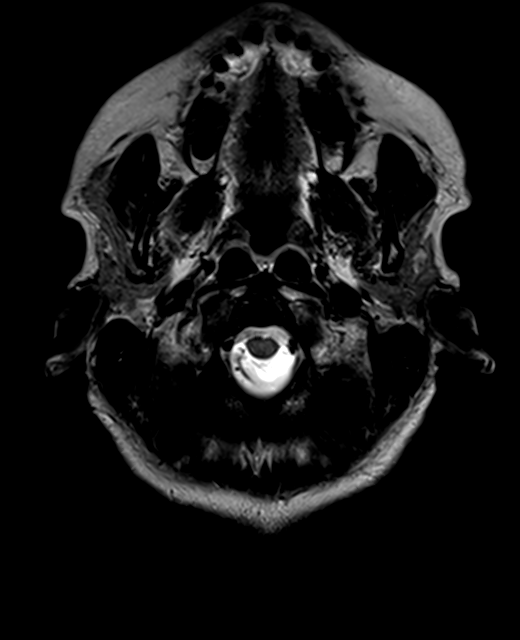
[im 27/27]
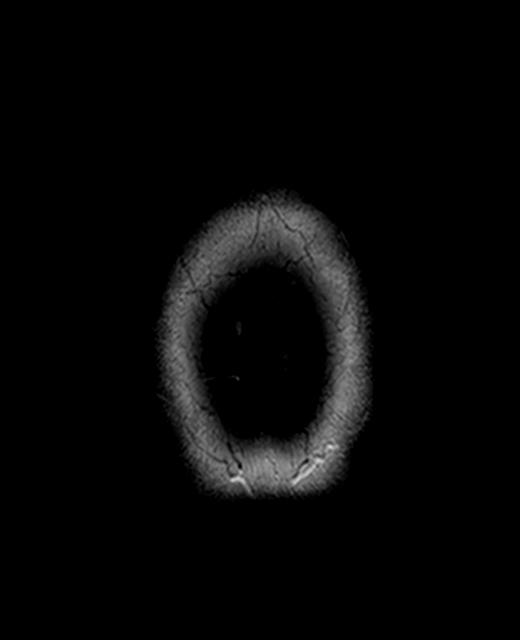

[Series 11: FLAIR · axial · 3.0mm · 0.72mm/px · z∈[-88,+60]mm · 2 of 26 slices shown]
[im 1/26]
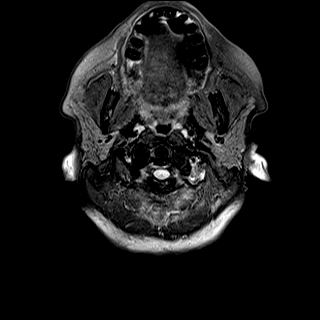
[im 26/26]
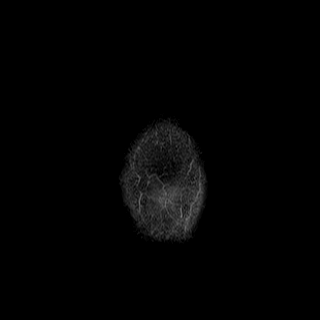

[Series 13: swi_images · axial · 1.5mm · 0.90mm/px · z∈[-85,+56]mm · 6 of 96 slices shown]
[im 1/96]
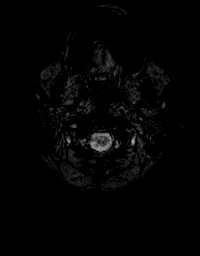
[im 20/96]
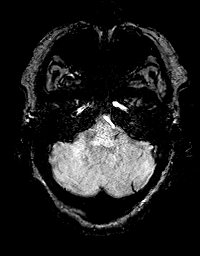
[im 39/96]
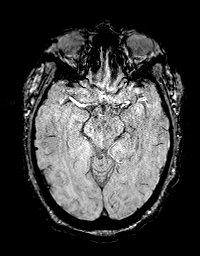
[im 58/96]
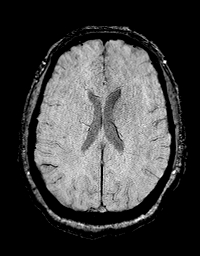
[im 77/96]
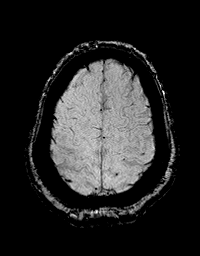
[im 96/96]
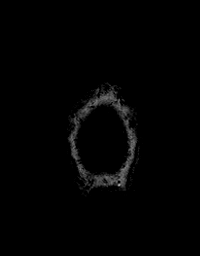

[Series 14: T1 · axial · 1.0mm · 0.94mm/px · z∈[-100,+56]mm · 10 of 160 slices shown (2 of 3)]
[im 1/160]
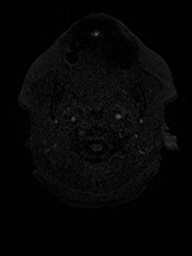
[im 18/160]
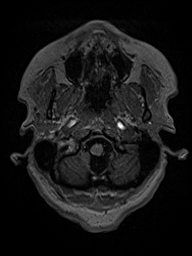
[im 36/160]
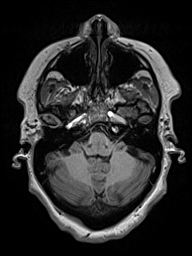
[im 54/160]
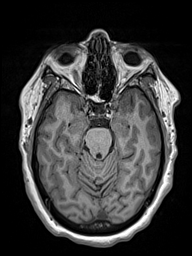
[im 71/160]
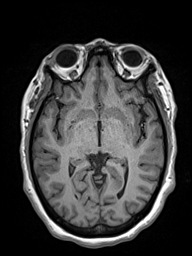
[im 89/160]
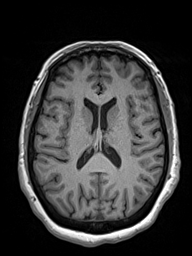
[im 107/160]
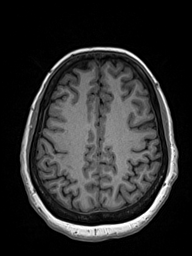
[im 124/160]
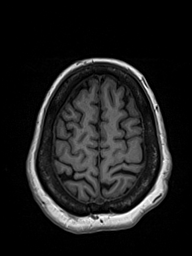
[im 142/160]
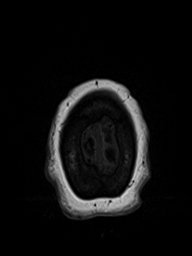
[im 160/160]
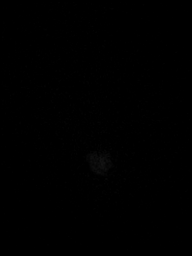

[Series 15: T2 post-contrast · coronal · 4.0mm · 0.36mm/px · 2 of 34 slices shown]
[im 1/34]
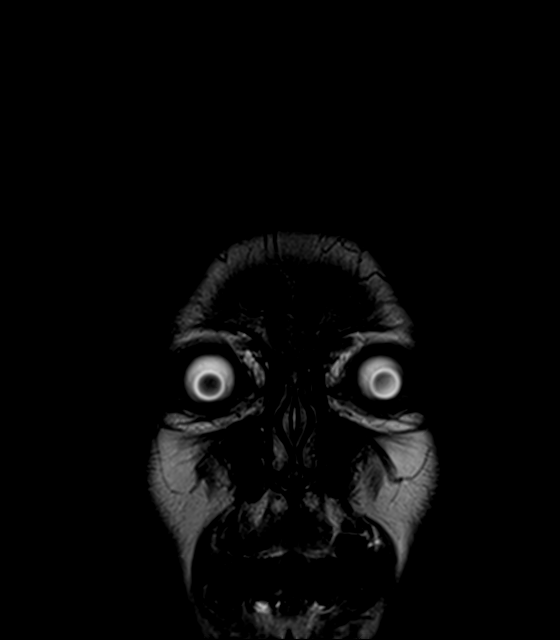
[im 34/34]
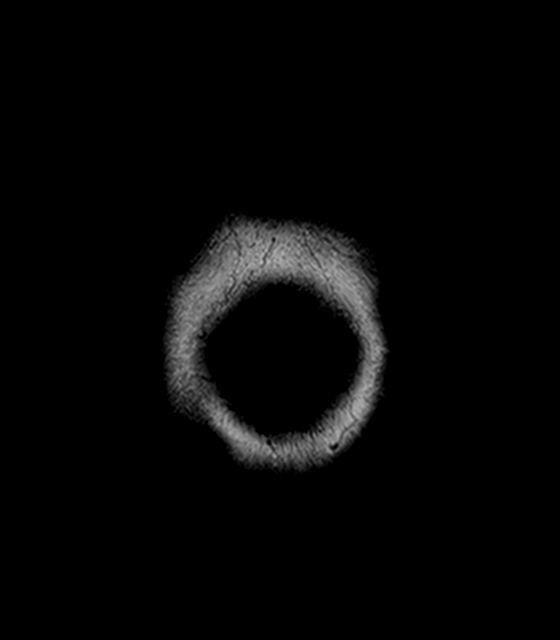

[Series 16: T1 · axial · 1.0mm · 0.94mm/px · z∈[-100,+56]mm · 10 of 160 slices shown (3 of 3)]
[im 1/160]
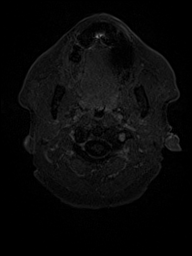
[im 18/160]
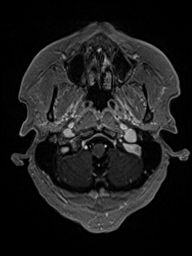
[im 36/160]
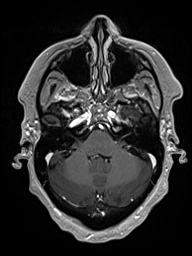
[im 54/160]
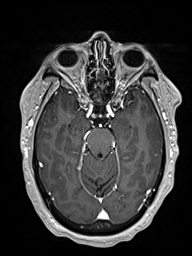
[im 71/160]
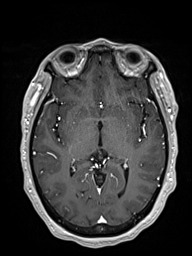
[im 89/160]
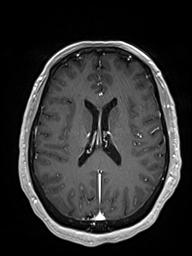
[im 107/160]
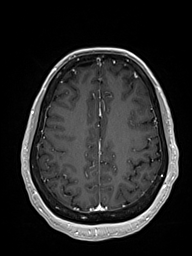
[im 124/160]
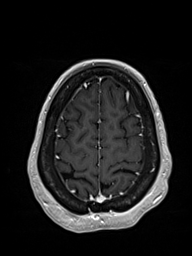
[im 142/160]
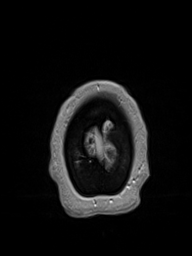
[im 160/160]
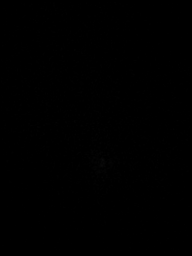

[Series 17: T1 post-contrast · coronal · 4.0mm · 0.72mm/px · 2 of 34 slices shown]
[im 1/34]
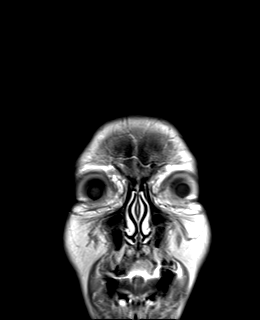
[im 34/34]
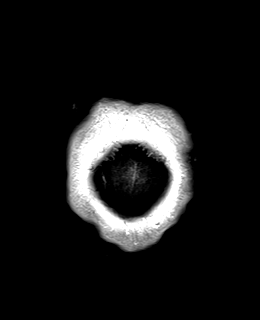

[48 of 48 positions shown; findings below may reference images not displayed]

FINDINGS: Brain: There is no evidence of an acute infarct, intracranial
hemorrhage, mass, midline shift, or extra-axial fluid collection.
The ventricles and sulci are normal. The brain is normal in signal.
No abnormal enhancement is identified. There is a partially empty
sella.

Vascular: Major intracranial vascular flow voids are preserved.

Skull and upper cervical spine: Unremarkable bone marrow signal.

Sinuses/Orbits: Unremarkable orbits. Paranasal sinuses and mastoid
air cells are clear.

Other: None.
IMPRESSION: Partially empty sella, likely incidental with this clinical history.
Otherwise unremarkable appearance of the brain.

## 2022-07-12 ENCOUNTER — Ambulatory Visit: Payer: 59 | Admitting: Podiatry

## 2022-07-12 ENCOUNTER — Telehealth: Payer: Self-pay | Admitting: *Deleted

## 2022-07-12 ENCOUNTER — Encounter: Payer: Self-pay | Admitting: Podiatry

## 2022-07-12 ENCOUNTER — Ambulatory Visit (INDEPENDENT_AMBULATORY_CARE_PROVIDER_SITE_OTHER): Payer: 59

## 2022-07-12 DIAGNOSIS — M778 Other enthesopathies, not elsewhere classified: Secondary | ICD-10-CM

## 2022-07-12 DIAGNOSIS — M7672 Peroneal tendinitis, left leg: Secondary | ICD-10-CM | POA: Diagnosis not present

## 2022-07-12 DIAGNOSIS — I83819 Varicose veins of unspecified lower extremities with pain: Secondary | ICD-10-CM | POA: Insufficient documentation

## 2022-07-12 DIAGNOSIS — K5900 Constipation, unspecified: Secondary | ICD-10-CM | POA: Insufficient documentation

## 2022-07-12 DIAGNOSIS — F419 Anxiety disorder, unspecified: Secondary | ICD-10-CM | POA: Insufficient documentation

## 2022-07-12 DIAGNOSIS — J309 Allergic rhinitis, unspecified: Secondary | ICD-10-CM | POA: Insufficient documentation

## 2022-07-12 DIAGNOSIS — R911 Solitary pulmonary nodule: Secondary | ICD-10-CM | POA: Insufficient documentation

## 2022-07-12 MED ORDER — MELOXICAM 15 MG PO TABS: 15.0000 mg | ORAL_TABLET | Freq: Every day | ORAL | 2 refills | Status: DC

## 2022-07-12 MED ORDER — TRIAMCINOLONE ACETONIDE 10 MG/ML IJ SUSP
10.0000 mg | Freq: Once | INTRAMUSCULAR | Status: AC
  Administered 2022-07-12: 10 mg

## 2022-07-12 NOTE — Telephone Encounter (Signed)
Patient calling to ask that the prescription sent to wrong pharmacy be cancelled, having problems getting it filled at right location. Called pharmacy and cancelled at [facility]), notified [facility] (right)that this was done.

## 2022-07-12 NOTE — Progress Notes (Signed)
Subjective:   Patient ID: Deanna Mcfarland, female   DOB: 61 y.o.   MRN: [id number]   HPI Patient had some pain underneath the left foot and it seems to have gotten better but is developing a lot of pain on the outside of the left foot with walking and patient states it is sore and makes it hard for her to be active.  Patient does not smoke likes to be active   Review of Systems  All other systems reviewed and are negative.       Objective:  Physical Exam Vitals and nursing note reviewed.  Constitutional:      Appearance: She is well-developed.  Pulmonary:     Effort: Pulmonary effort is normal.  Musculoskeletal:        General: Normal range of motion.  Skin:    General: Skin is warm.  Neurological:     Mental Status: She is alert.     Neurovascular status intact muscle strength found to be adequate range of motion within normal limits with patient found to have pain that at this time is mostly centralized around the peroneal tendon left at its insertion with no indications of muscle strength loss or muscle strength issues.  Patient has good digital perfusion well oriented x3 with mild discomfort underneath the lesser MPJs but minimal currently     Assessment:  Inflammatory peroneal tendinitis left with fluid buildup      Plan:  H&P reviewed condition and I Minna focus on the outside and I did do sterile prep and I injected the insertion of the peroneal tendon lateral 3 mg Dexasone Kenalog 5 mg Xylocaine advised on ice therapy and I dispensed a fascial brace to hold up the outside of the left foot and patient will be seen back as needed encouraged to call questions concerns may require MRI or immobilization  X-rays were negative for signs of bony injury or other pathology

## 2022-09-13 ENCOUNTER — Ambulatory Visit: Payer: 59 | Attending: Cardiology | Admitting: Cardiology

## 2022-09-13 ENCOUNTER — Encounter: Payer: Self-pay | Admitting: Cardiology

## 2022-09-13 ENCOUNTER — Ambulatory Visit: Payer: 59 | Admitting: Cardiology

## 2022-09-13 VITALS — BP 132/86 | HR 72 | Ht 67.0 in | Wt 226.4 lb

## 2022-09-13 DIAGNOSIS — E669 Obesity, unspecified: Secondary | ICD-10-CM | POA: Diagnosis not present

## 2022-09-13 DIAGNOSIS — I10 Essential (primary) hypertension: Secondary | ICD-10-CM | POA: Diagnosis not present

## 2022-09-13 NOTE — Patient Instructions (Addendum)
Medication Instructions:  Your physician recommends that you continue on your current medications as directed. Please refer to the Current Medication list given to you today.  *If you need a refill on your cardiac medications before your next appointment, please call your pharmacy*   Lab Work: None   Testing/Procedures: None   Follow-Up: At [facility], you and your health needs are our priority.  As part of our continuing mission to provide you with exceptional heart care, we have created designated Provider Care Teams.  These Care Teams include your primary Cardiologist (physician) and Advanced Practice Providers (APPs -  Physician Assistants and Nurse Practitioners) who all work together to provide you with the care you need, when you need it.  We recommend signing up for the patient portal called "MyChart".  Sign up information is provided on this After Visit Summary.  MyChart is used to connect with patients for Virtual Visits (Telemedicine).  Patients are able to view lab/test results, encounter notes, upcoming appointments, etc.  Non-urgent messages can be sent to your provider as well.   To learn more about what you can do with MyChart, go to [email or web address].    Your next appointment:   1 year(s)  The format for your next appointment:   In Person  Provider:   Kardie Tobb, DO     Other Instructions   Important Information About Sugar       

## 2022-09-15 NOTE — Progress Notes (Signed)
Cardiology Office Note:    Date:  09/15/2022   ID:  Deanna Mcfarland, DOB Mar 20, 1961, MRN [id number]  PCP:  Laurann Montana, MD  Cardiologist:  Thomasene Ripple, DO  Electrophysiologist:  None   Referring MD: Laurann Montana, MD   " I am doing well but my blood pressure has been high"   History of Present Illness:    Deanna Mcfarland is a 61 y.o. female with a hx of  Covid-19 infection, hypertension here for follow-up visit.  I first saw the patient December 2022 at that time she was experiencing shortness of breath and significant palpitations.  She was also hypertensive.  I stopped the beta-blocker and started the patient on Cardizem 180 mg today.  In addition to the place a monitor the patient as well as got an echocardiogram.  She did get her monitor and her echocardiogram.  At her visit on 03/13/2022 she was hypertensive- I added HCTZ to her regimen. NO commpliants today.  She recently lost a family member.  Past Medical History:  Diagnosis Date   Hypertension     Past Surgical History:  Procedure Laterality Date   TUBAL LIGATION      Current Medications: Current Meds  Medication Sig   ascorbic acid (VITAMIN C) 1000 MG tablet Take 1,000 mg by mouth daily.   Calcium Carbonate (CALCIUM 500 PO) Take by mouth.   Cetirizine HCl (ZYRTEC ALLERGY) 10 MG CAPS Zyrtec   cholecalciferol (VITAMIN D3) 25 MCG (1000 UNIT) tablet Take 1,000 Units by mouth daily.   diltiazem (CARDIZEM CD) 180 MG 24 hr capsule Take 1 capsule (180 mg total) by mouth daily.   fluticasone (FLONASE) 50 MCG/ACT nasal spray Place 2 sprays into both nostrils daily as needed for allergies.    hydrochlorothiazide (MICROZIDE) 12.5 MG capsule Take 1 capsule (12.5 mg total) by mouth daily.   Multiple Vitamin (MULTIVITAMIN) tablet Take 1 tablet by mouth daily.   Zinc Sulfate (ZINC 15 PO) Take by mouth.     Allergies:   Amlodipine   Social History   Socioeconomic History   Marital status: Married    Spouse name: Not on  file   Number of children: Not on file   Years of education: Not on file   Highest education level: Not on file  Occupational History   Not on file  Tobacco Use   Smoking status: Never   Smokeless tobacco: Never  Substance and Sexual Activity   Alcohol use: Yes    Comment: occasional wine   Drug use: No   Sexual activity: Not on file  Other Topics Concern   Not on file  Social History Narrative   Not on file   Social Determinants of Health   Financial Resource Strain: Not on file  Food Insecurity: Not on file  Transportation Needs: Not on file  Physical Activity: Not on file  Stress: Not on file  Social Connections: Not on file     Family History: The patient's family history includes Hypertension in her father and mother; Thyroid disease in her mother.  ROS:   Review of Systems  Constitution: Negative for decreased appetite, fever and weight gain.  HENT: Negative for congestion, ear discharge, hoarse voice and sore throat.   Eyes: Negative for discharge, redness, vision loss in right eye and visual halos.  Cardiovascular: Negative for chest pain, dyspnea on exertion, leg swelling, orthopnea and palpitations.  Respiratory: Negative for cough, hemoptysis, shortness of breath and snoring.   Endocrine: Negative for  heat intolerance and polyphagia.  Hematologic/Lymphatic: Negative for bleeding problem. Does not bruise/bleed easily.  Skin: Negative for flushing, nail changes, rash and suspicious lesions.  Musculoskeletal: Negative for arthritis, joint pain, muscle cramps, myalgias, neck pain and stiffness.  Gastrointestinal: Negative for abdominal pain, bowel incontinence, diarrhea and excessive appetite.  Genitourinary: Negative for decreased libido, genital sores and incomplete emptying.  Neurological: Negative for brief paralysis, focal weakness, headaches and loss of balance.  Psychiatric/Behavioral: Negative for altered mental status, depression and suicidal ideas.   Allergic/Immunologic: Negative for HIV exposure and persistent infections.    EKGs/Labs/Other Studies Reviewed:    The following studies were reviewed today:   EKG: None today  TTE 01/01/2022 IMPRESSIONS     1. Left ventricular ejection fraction, by estimation, is 65 to 70%. The  left ventricle has normal function. The left ventricle has no regional  wall motion abnormalities. Left ventricular diastolic parameters were  normal.   2. Right ventricular systolic function is low normal. The right  ventricular size is mildly enlarged.   3. Right atrial size was mildly dilated.   4. The mitral valve is normal in structure. Trivial mitral valve  regurgitation.   5. The aortic valve is tricuspid. Aortic valve regurgitation is not  visualized.   6. The inferior vena cava is normal in size with greater than 50%  respiratory variability, suggesting right atrial pressure of 3 mmHg.   FINDINGS   Left Ventricle: Left ventricular ejection fraction, by estimation, is 65  to 70%. The left ventricle has normal function. The left ventricle has no  regional wall motion abnormalities. The left ventricular internal cavity  size was normal in size. There is   no left ventricular hypertrophy. Left ventricular diastolic parameters  were normal.   Right Ventricle: The right ventricular size is mildly enlarged. Right  vetricular wall thickness was not assessed. Right ventricular systolic  function is low normal.   Left Atrium: Left atrial size was normal in size.   Right Atrium: Right atrial size was mildly dilated.   Pericardium: There is no evidence of pericardial effusion.   Mitral Valve: The mitral valve is normal in structure. Trivial mitral  valve regurgitation.   Tricuspid Valve: The tricuspid valve is normal in structure. Tricuspid  valve regurgitation is mild.   Aortic Valve: The aortic valve is tricuspid. Aortic valve regurgitation is  not visualized.   ZIO monitor  01/11/2022 Patch Wear Time:  14 days and 0 hours (2022-12-29T11:06:54-0500 to 2023-01-12T11:06:58-0500)   Patient had a min HR of 47 bpm, max HR of 158 bpm, and avg HR of 69 bpm. Predominant underlying rhythm was Sinus Rhythm. Slight P wave morphology changes were noted. Isolated SVEs were rare (<1.0%), SVE Couplets were rare (<1.0%), and SVE Triplets were  rare (<1.0%). Isolated VEs were rare (<1.0%), and no VE Couplets or VE Triplets were present.   Symptoms associated with sinus bradycardia and sinus rhythm.   Conclusion: Unremarkable study with no evidence of significant arrhythmia.  Recent Labs: No results found for requested labs within last 365 days.  Recent Lipid Panel No results found for: "CHOL", "TRIG", "HDL", "CHOLHDL", "VLDL", "LDLCALC", "LDLDIRECT"  Physical Exam:    VS:  BP 132/86   Pulse 72   Ht 5\' 7"  (1.702 m)   Wt 226 lb 6.4 oz (102.7 kg)   SpO2 100%   BMI 35.46 kg/m     Wt Readings from Last 3 Encounters:  09/13/22 226 lb 6.4 oz (102.7 kg)  03/13/22 228  lb 12.8 oz (103.8 kg)  12/20/21 228 lb (103.4 kg)     GEN: Well nourished, well developed in no acute distress HEENT: Normal NECK: No JVD; No carotid bruits LYMPHATICS: No lymphadenopathy CARDIAC: S1S2 noted,RRR, no murmurs, rubs, gallops RESPIRATORY:  Clear to auscultation without rales, wheezing or rhonchi  ABDOMEN: Soft, non-tender, non-distended, +bowel sounds, no guarding. EXTREMITIES: No edema, No cyanosis, no clubbing MUSCULOSKELETAL:  No deformity  SKIN: Warm and dry NEUROLOGIC:  Alert and oriented x 3, non-focal PSYCHIATRIC:  Normal affect, good insight  ASSESSMENT:    1. Primary hypertension   2. Obesity (BMI 30-39.9)     PLAN:    She is doing well from a CV standpoint. No need for any medication change. Will send refills.   The patient is in agreement with the above plan. The patient left the office in stable condition.  The patient will follow up in   Medication Adjustments/Labs  and Tests Ordered: Current medicines are reviewed at length with the patient today.  Concerns regarding medicines are outlined above.  No orders of the defined types were placed in this encounter.  No orders of the defined types were placed in this encounter.   Patient Instructions  Medication Instructions:  Your physician recommends that you continue on your current medications as directed. Please refer to the Current Medication list given to you today.  *If you need a refill on your cardiac medications before your next appointment, please call your pharmacy*   Lab Work: None  Testing/Procedures: None   Follow-Up: At [facility], you and your health needs are our priority.  As part of our continuing mission to provide you with exceptional heart care, we have created designated Provider Care Teams.  These Care Teams include your primary Cardiologist (physician) and Advanced Practice Providers (APPs -  Physician Assistants and Nurse Practitioners) who all work together to provide you with the care you need, when you need it.  We recommend signing up for the patient portal called "MyChart".  Sign up information is provided on this After Visit Summary.  MyChart is used to connect with patients for Virtual Visits (Telemedicine).  Patients are able to view lab/test results, encounter notes, upcoming appointments, etc.  Non-urgent messages can be sent to your provider as well.   To learn more about what you can do with MyChart, go to [email or web address].    Your next appointment:   1 year(s)  The format for your next appointment:   In Person  Provider:   Thomasene Ripple, DO     Other Instructions   Important Information About Sugar         Adopting a Healthy Lifestyle.  Know what a healthy weight is for you (roughly BMI <25) and aim to maintain this   Aim for 7+ servings of fruits and vegetables daily   65-80+ fluid ounces of water or unsweet tea for healthy  kidneys   Limit to max 1 drink of alcohol per day; avoid smoking/tobacco   Limit animal fats in diet for cholesterol and heart health - choose grass fed whenever available   Avoid highly processed foods, and foods high in saturated/trans fats   Aim for low stress - take time to unwind and care for your mental health   Aim for 150 min of moderate intensity exercise weekly for heart health, and weights twice weekly for bone health   Aim for 7-9 hours of sleep daily   When it comes to diets, agreement  about the perfect plan isnt easy to find, even among the experts. Experts at the [employer] developed an idea known as the Healthy Eating Plate. Just imagine a plate divided into logical, healthy portions.   The emphasis is on diet quality:   Load up on vegetables and fruits - one-half of your plate: Aim for color and variety, and remember that potatoes dont count.   Go for whole grains - one-quarter of your plate: Whole wheat, barley, wheat berries, quinoa, oats, brown rice, and foods made with them. If you want pasta, go with whole wheat pasta.   Protein power - one-quarter of your plate: Fish, chicken, beans, and nuts are all healthy, versatile protein sources. Limit red meat.   The diet, however, does go beyond the plate, offering a few other suggestions.   Use healthy plant oils, such as olive, canola, soy, corn, sunflower and peanut. Check the labels, and avoid partially hydrogenated oil, which have unhealthy trans fats.   If youre thirsty, drink water. Coffee and tea are good in moderation, but skip sugary drinks and limit milk and dairy products to one or two daily servings.   The type of carbohydrate in the diet is more important than the amount. Some sources of carbohydrates, such as vegetables, fruits, whole grains, and beans-are healthier than others.   Finally, stay active  Signed, Berniece Salines, DO  09/15/2022 6:48 PM    [facility]

## 2022-11-14 ENCOUNTER — Other Ambulatory Visit: Payer: Self-pay | Admitting: Cardiology

## 2023-01-03 ENCOUNTER — Encounter: Payer: Self-pay | Admitting: Cardiology

## 2023-02-11 DIAGNOSIS — Z1231 Encounter for screening mammogram for malignant neoplasm of breast: Secondary | ICD-10-CM | POA: Diagnosis not present

## 2023-03-09 ENCOUNTER — Other Ambulatory Visit: Payer: Self-pay | Admitting: Cardiology

## 2023-03-10 NOTE — Telephone Encounter (Signed)
Rx refill sent to pharmacy. 

## 2023-03-27 DIAGNOSIS — Z01419 Encounter for gynecological examination (general) (routine) without abnormal findings: Secondary | ICD-10-CM | POA: Diagnosis not present

## 2023-03-27 DIAGNOSIS — Z124 Encounter for screening for malignant neoplasm of cervix: Secondary | ICD-10-CM | POA: Diagnosis not present

## 2023-03-27 DIAGNOSIS — Z1151 Encounter for screening for human papillomavirus (HPV): Secondary | ICD-10-CM | POA: Diagnosis not present

## 2023-05-06 DIAGNOSIS — I10 Essential (primary) hypertension: Secondary | ICD-10-CM | POA: Diagnosis not present

## 2023-09-15 ENCOUNTER — Encounter: Payer: Self-pay | Admitting: Cardiology

## 2023-09-15 ENCOUNTER — Ambulatory Visit: Payer: 59 | Attending: Cardiology | Admitting: Cardiology

## 2023-09-15 VITALS — BP 132/86 | HR 63 | Ht 67.0 in | Wt 221.4 lb

## 2023-09-15 DIAGNOSIS — R002 Palpitations: Secondary | ICD-10-CM | POA: Diagnosis not present

## 2023-09-15 DIAGNOSIS — I517 Cardiomegaly: Secondary | ICD-10-CM | POA: Diagnosis not present

## 2023-09-15 DIAGNOSIS — I10 Essential (primary) hypertension: Secondary | ICD-10-CM

## 2023-09-15 MED ORDER — HYDROCHLOROTHIAZIDE 25 MG PO TABS: 25.0000 mg | ORAL_TABLET | Freq: Every day | ORAL | 3 refills | Status: DC

## 2023-09-15 NOTE — Progress Notes (Signed)
Cardiology Office Note:    Date:  09/15/2023   ID:  Deanna Mcfarland, DOB 1961/10/13, MRN [id number]  PCP:  Deanna Montana, MD  Cardiologist:  Thomasene Ripple, DO  Electrophysiologist:  None   Referring MD: Deanna Montana, MD   No chief complaint on file. " I am ok"  History of Present Illness:    Deanna Mcfarland is a 62 y.o. female with a hx of Covid-19 infection, hypertension here for follow-up visit   She reports intermittent left arm discomfort. The discomfort is located in the upper arm and shoulder area and is present 'most of the time,' particularly with arm movement. The patient describes the discomfort as mild and has not taken any over-the-counter pain medications for it. The discomfort has been occurring approximately once every other week for the past three months.  The patient also reports concerns about her blood pressure control. Despite being on two antihypertensive medications, her blood pressure readings have been on the higher end of the normal range. The patient is interested in strategies to further lower her blood pressure.    Past Medical History:  Diagnosis Date   Hypertension     Past Surgical History:  Procedure Laterality Date   TUBAL LIGATION      Current Medications: Current Meds  Medication Sig   ascorbic acid (VITAMIN C) 1000 MG tablet Take 1,000 mg by mouth daily.   Calcium Carbonate (CALCIUM 500 PO) Take by mouth.   Cetirizine HCl (ZYRTEC ALLERGY) 10 MG CAPS Zyrtec   cholecalciferol (VITAMIN D3) 25 MCG (1000 UNIT) tablet Take 1,000 Units by mouth daily.   diltiazem (CARDIZEM CD) 180 MG 24 hr capsule TAKE 1 CAPSULE BY MOUTH EVERY DAY   fluticasone (FLONASE) 50 MCG/ACT nasal spray Place 2 sprays into both nostrils daily as needed for allergies.    hydrochlorothiazide (HYDRODIURIL) 25 MG tablet Take 1 tablet (25 mg total) by mouth daily.   Multiple Vitamin (MULTIVITAMIN) tablet Take 1 tablet by mouth daily.   Zinc Sulfate (ZINC 15 PO) Take by  mouth.   [DISCONTINUED] hydrochlorothiazide (MICROZIDE) 12.5 MG capsule Take 1 capsule (12.5 mg total) by mouth daily.     Allergies:   Amlodipine   Social History   Socioeconomic History   Marital status: Married    Spouse name: Not on file   Number of children: Not on file   Years of education: Not on file   Highest education level: Not on file  Occupational History   Not on file  Tobacco Use   Smoking status: Never   Smokeless tobacco: Never  Substance and Sexual Activity   Alcohol use: Yes    Comment: occasional wine   Drug use: No   Sexual activity: Not on file  Other Topics Concern   Not on file  Social History Narrative   Not on file   Social Determinants of Health   Financial Resource Strain: Not on file  Food Insecurity: Not on file  Transportation Needs: Not on file  Physical Activity: Not on file  Stress: Not on file  Social Connections: Not on file     Family History: The patient's family history includes Hypertension in her father and mother; Thyroid disease in her mother.  ROS:   Review of Systems  Constitution: Negative for decreased appetite, fever and weight gain.  HENT: Negative for congestion, ear discharge, hoarse voice and sore throat.   Eyes: Negative for discharge, redness, vision loss in right eye and visual halos.  Cardiovascular:  Negative for chest pain, dyspnea on exertion, leg swelling, orthopnea and palpitations.  Respiratory: Negative for cough, hemoptysis, shortness of breath and snoring.   Endocrine: Negative for heat intolerance and polyphagia.  Hematologic/Lymphatic: Negative for bleeding problem. Does not bruise/bleed easily.  Skin: Negative for flushing, nail changes, rash and suspicious lesions.  Musculoskeletal: Negative for arthritis, joint pain, muscle cramps, myalgias, neck pain and stiffness.  Gastrointestinal: Negative for abdominal pain, bowel incontinence, diarrhea and excessive appetite.  Genitourinary: Negative for  decreased libido, genital sores and incomplete emptying.  Neurological: Negative for brief paralysis, focal weakness, headaches and loss of balance.  Psychiatric/Behavioral: Negative for altered mental status, depression and suicidal ideas.  Allergic/Immunologic: Negative for HIV exposure and persistent infections.    EKGs/Labs/Other Studies Reviewed:    The following studies were reviewed today:   EKG:  The ekg ordered today demonstrates   Recent Labs: No results found for requested labs within last 365 days.  Recent Lipid Panel No results found for: "CHOL", "TRIG", "HDL", "CHOLHDL", "VLDL", "LDLCALC", "LDLDIRECT"  Physical Exam:    VS:  BP 132/86   Pulse 63   Ht 5\' 7"  (1.702 m)   Wt 221 lb 6.4 oz (100.4 kg)   SpO2 100%   BMI 34.68 kg/m     Wt Readings from Last 3 Encounters:  09/15/23 221 lb 6.4 oz (100.4 kg)  09/13/22 226 lb 6.4 oz (102.7 kg)  03/13/22 228 lb 12.8 oz (103.8 kg)     GEN: Well nourished, well developed in no acute distress HEENT: Normal NECK: No JVD; No carotid bruits LYMPHATICS: No lymphadenopathy CARDIAC: S1S2 noted,RRR, no murmurs, rubs, gallops RESPIRATORY:  Clear to auscultation without rales, wheezing or rhonchi  ABDOMEN: Soft, non-tender, non-distended, +bowel sounds, no guarding. EXTREMITIES: No edema, No cyanosis, no clubbing MUSCULOSKELETAL:  No deformity  SKIN: Warm and dry NEUROLOGIC:  Alert and oriented x 3, non-focal PSYCHIATRIC:  Normal affect, good insight  ASSESSMENT:    1. Biatrial enlargement   2. Primary hypertension   3. Palpitations    PLAN:    Chest Discomfort Intermittent left-sided chest discomfort, likely musculoskeletal in nature given location and relation to movement. No associated shortness of breath. Advised to monitor and report if frequency increases.  -Continue to monitor symptoms and report if frequency increases.  Hypertension Blood pressure slightly elevated at 136/84. Discussed options for medication  adjustment. -Increase Hydrochlorothiazide to 25mg  daily. -Check blood pressure daily for two weeks and upload readings to MyChart.  General Health Maintenance / Followup Plans -Annual visit with primary care provider scheduled for early next year. -Follow-up visit after two weeks of increased Hydrochlorothiazide dose, pending review of home blood pressure readings.  The patient is in agreement with the above plan. The patient left the office in stable condition.  The patient will follow up in 1 year or sooner if needed.   Medication Adjustments/Labs and Tests Ordered: Current medicines are reviewed at length with the patient today.  Concerns regarding medicines are outlined above.  Orders Placed This Encounter  Procedures   EKG 12-Lead   Meds ordered this encounter  Medications   hydrochlorothiazide (HYDRODIURIL) 25 MG tablet    Sig: Take 1 tablet (25 mg total) by mouth daily.    Dispense:  90 tablet    Refill:  3    Patient Instructions  Medication Instructions:  Your physician has recommended you make the following change in your medication:  INCREASE: Hydrochlorothiazide 25 mg once daily *If you need a refill on your  cardiac medications before your next appointment, please call your pharmacy*   Lab Work: None    Testing/Procedures: None   Follow-Up: At [facility], you and your health needs are our priority.  As part of our continuing mission to provide you with exceptional heart care, we have created designated Provider Care Teams.  These Care Teams include your primary Cardiologist (physician) and Advanced Practice Providers (APPs -  Physician Assistants and Nurse Practitioners) who all work together to provide you with the care you need, when you need it.   Your next appointment:   1 year(s)  Provider:   Thomasene Ripple, DO     Adopting a Healthy Lifestyle.  Know what a healthy weight is for you (roughly BMI <25) and aim to maintain this   Aim for 7+  servings of fruits and vegetables daily   65-80+ fluid ounces of water or unsweet tea for healthy kidneys   Limit to max 1 drink of alcohol per day; avoid smoking/tobacco   Limit animal fats in diet for cholesterol and heart health - choose grass fed whenever available   Avoid highly processed foods, and foods high in saturated/trans fats   Aim for low stress - take time to unwind and care for your mental health   Aim for 150 min of moderate intensity exercise weekly for heart health, and weights twice weekly for bone health   Aim for 7-9 hours of sleep daily   When it comes to diets, agreement about the perfect plan isnt easy to find, even among the experts. Experts at the [facility] [employer] developed an idea known as the Healthy Eating Plate. Just imagine a plate divided into logical, healthy portions.   The emphasis is on diet quality:   Load up on vegetables and fruits - one-half of your plate: Aim for color and variety, and remember that potatoes dont count.   Go for whole grains - one-quarter of your plate: Whole wheat, barley, wheat berries, quinoa, oats, brown rice, and foods made with them. If you want pasta, go with whole wheat pasta.   Protein power - one-quarter of your plate: Fish, chicken, beans, and nuts are all healthy, versatile protein sources. Limit red meat.   The diet, however, does go beyond the plate, offering a few other suggestions.   Use healthy plant oils, such as olive, canola, soy, corn, sunflower and peanut. Check the labels, and avoid partially hydrogenated oil, which have unhealthy trans fats.   If youre thirsty, drink water. Coffee and tea are good in moderation, but skip sugary drinks and limit milk and dairy products to one or two daily servings.   The type of carbohydrate in the diet is more important than the amount. Some sources of carbohydrates, such as vegetables, fruits, whole grains, and beans-are healthier than others.    Finally, stay active  Osvaldo Shipper, DO  09/15/2023 9:14 PM    [facility]

## 2023-09-15 NOTE — Patient Instructions (Addendum)
Medication Instructions:  Your physician has recommended you make the following change in your medication:  INCREASE: Hydrochlorothiazide 25 mg once daily *If you need a refill on your cardiac medications before your next appointment, please call your pharmacy*   Lab Work: None    Testing/Procedures: None   Follow-Up: At [facility], you and your health needs are our priority.  As part of our continuing mission to provide you with exceptional heart care, we have created designated Provider Care Teams.  These Care Teams include your primary Cardiologist (physician) and Advanced Practice Providers (APPs -  Physician Assistants and Nurse Practitioners) who all work together to provide you with the care you need, when you need it.   Your next appointment:   1 year(s)  Provider:   Thomasene Ripple, DO

## 2023-11-11 ENCOUNTER — Other Ambulatory Visit: Payer: Self-pay | Admitting: Cardiology

## 2024-02-25 DIAGNOSIS — Z1231 Encounter for screening mammogram for malignant neoplasm of breast: Secondary | ICD-10-CM | POA: Diagnosis not present

## 2024-05-04 ENCOUNTER — Encounter: Payer: Self-pay | Admitting: Cardiology

## 2024-05-12 ENCOUNTER — Ambulatory Visit: Admitting: Podiatry

## 2024-05-12 DIAGNOSIS — H269 Unspecified cataract: Secondary | ICD-10-CM | POA: Diagnosis not present

## 2024-05-12 DIAGNOSIS — I10 Essential (primary) hypertension: Secondary | ICD-10-CM | POA: Diagnosis not present

## 2024-05-12 DIAGNOSIS — Z6836 Body mass index (BMI) 36.0-36.9, adult: Secondary | ICD-10-CM | POA: Diagnosis not present

## 2024-05-12 DIAGNOSIS — Z8249 Family history of ischemic heart disease and other diseases of the circulatory system: Secondary | ICD-10-CM | POA: Diagnosis not present

## 2024-05-19 ENCOUNTER — Encounter: Payer: Self-pay | Admitting: Podiatry

## 2024-05-19 ENCOUNTER — Ambulatory Visit: Admitting: Podiatry

## 2024-05-19 VITALS — Ht 67.0 in | Wt 221.0 lb

## 2024-05-19 DIAGNOSIS — M7751 Other enthesopathy of right foot: Secondary | ICD-10-CM

## 2024-05-19 MED ORDER — TRIAMCINOLONE ACETONIDE 10 MG/ML IJ SUSP
10.0000 mg | Freq: Once | INTRAMUSCULAR | Status: AC
  Administered 2024-05-19: 10 mg via INTRA_ARTICULAR

## 2024-05-19 NOTE — Progress Notes (Signed)
 Subjective:   Patient ID: Deanna Mcfarland, female   DOB: 63 y.o.   MRN: [id number]   HPI Patient presents stating that the big toe joint right has been bothering her and she does not remember specific injury   ROS      Objective:  Physical Exam  Neurovascular status intact with inflammation fluid buildup around the first MPJ right painful when pressed no range of motion loss     Assessment:  Probability for inflammatory capsulitis of the first MPJ right foot     Plan:  H&P reviewed went ahead today sterile prep and did periarticular injection of the first MPJ 3 mg Kenalog  5 mg Xylocaine advised on rigid bottom shoes reappoint as needed

## 2024-05-27 DIAGNOSIS — I10 Essential (primary) hypertension: Secondary | ICD-10-CM | POA: Diagnosis not present

## 2024-06-16 DIAGNOSIS — I87393 Chronic venous hypertension (idiopathic) with other complications of bilateral lower extremity: Secondary | ICD-10-CM | POA: Diagnosis not present

## 2024-06-16 DIAGNOSIS — M79662 Pain in left lower leg: Secondary | ICD-10-CM | POA: Diagnosis not present

## 2024-06-16 DIAGNOSIS — M79605 Pain in left leg: Secondary | ICD-10-CM | POA: Diagnosis not present

## 2024-06-16 DIAGNOSIS — M79661 Pain in right lower leg: Secondary | ICD-10-CM | POA: Diagnosis not present

## 2024-06-16 DIAGNOSIS — M79604 Pain in right leg: Secondary | ICD-10-CM | POA: Diagnosis not present

## 2024-06-30 DIAGNOSIS — R221 Localized swelling, mass and lump, neck: Secondary | ICD-10-CM | POA: Diagnosis not present

## 2024-06-30 DIAGNOSIS — E042 Nontoxic multinodular goiter: Secondary | ICD-10-CM | POA: Diagnosis not present

## 2024-06-30 DIAGNOSIS — E049 Nontoxic goiter, unspecified: Secondary | ICD-10-CM | POA: Diagnosis not present

## 2024-07-02 ENCOUNTER — Other Ambulatory Visit: Payer: Self-pay | Admitting: Family Medicine

## 2024-07-02 DIAGNOSIS — E041 Nontoxic single thyroid nodule: Secondary | ICD-10-CM

## 2024-07-07 ENCOUNTER — Ambulatory Visit
Admission: RE | Admit: 2024-07-07 | Discharge: 2024-07-07 | Disposition: A | Discharge status: Home / Self Care | Source: Ambulatory Visit | Attending: Family Medicine | Admitting: Family Medicine

## 2024-07-07 ENCOUNTER — Other Ambulatory Visit (HOSPITAL_COMMUNITY)
Admission: RE | Admit: 2024-07-07 | Discharge: 2024-07-07 | Disposition: A | Discharge status: Home / Self Care | Source: Ambulatory Visit | Attending: Family Medicine | Admitting: Family Medicine

## 2024-07-07 DIAGNOSIS — E041 Nontoxic single thyroid nodule: Secondary | ICD-10-CM | POA: Insufficient documentation

## 2024-07-07 DIAGNOSIS — E042 Nontoxic multinodular goiter: Secondary | ICD-10-CM | POA: Diagnosis not present

## 2024-07-09 LAB — CYTOLOGY - NON PAP

## 2024-07-28 DIAGNOSIS — Z01419 Encounter for gynecological examination (general) (routine) without abnormal findings: Secondary | ICD-10-CM | POA: Diagnosis not present

## 2024-09-01 ENCOUNTER — Encounter: Payer: Self-pay | Admitting: Cardiology

## 2024-09-01 ENCOUNTER — Ambulatory Visit: Attending: Cardiovascular Disease | Admitting: Cardiology

## 2024-09-01 VITALS — BP 110/70 | HR 69 | Ht 67.0 in | Wt 215.2 lb

## 2024-09-01 DIAGNOSIS — I10 Essential (primary) hypertension: Secondary | ICD-10-CM | POA: Diagnosis not present

## 2024-09-01 DIAGNOSIS — R002 Palpitations: Secondary | ICD-10-CM | POA: Diagnosis not present

## 2024-09-01 NOTE — Patient Instructions (Signed)

## 2024-09-05 NOTE — Progress Notes (Signed)
 Cardiology Office Note:    Date:  09/05/2024   ID:  Deanna Mcfarland, DOB 04-Mar-1961, MRN [id number]  PCP:  Teresa Channel, MD  Cardiologist:  Dub Huntsman, DO  Electrophysiologist:  None   Referring MD: Teresa Channel, MD   No chief complaint on file.  I am ok  History of Present Illness:    Deanna Mcfarland is a 63 y.o. female with a hx of Covid-19 infection, hypertension here for follow-up visit  She offers no complaints at this time.   The patient is involved in caring for her mother, who is undergoing treatment and physical therapy. The patient has family support from aunts and a brother, although the brother is dealing with his own issues.   Past Medical History:  Diagnosis Date   Hypertension     Past Surgical History:  Procedure Laterality Date   TUBAL LIGATION      Current Medications: Current Meds  Medication Sig   Cetirizine HCl (ZYRTEC ALLERGY) 10 MG CAPS Zyrtec   diltiazem  (CARDIZEM  CD) 180 MG 24 hr capsule TAKE 1 CAPSULE BY MOUTH EVERY DAY   hydrochlorothiazide  (HYDRODIURIL ) 25 MG tablet Take 1 tablet (25 mg total) by mouth daily.   LORazepam (ATIVAN) 0.5 MG tablet Take 0.25-0.5 mg by mouth 3 (three) times daily as needed.   Multiple Vitamin (MULTIVITAMIN) tablet Take 1 tablet by mouth daily.     Allergies:   Amlodipine   Social History   Socioeconomic History   Marital status: Married    Spouse name: Not on file   Number of children: Not on file   Years of education: Not on file   Highest education level: Not on file  Occupational History   Not on file  Tobacco Use   Smoking status: Never   Smokeless tobacco: Never  Substance and Sexual Activity   Alcohol use: Yes    Comment: occasional wine   Drug use: No   Sexual activity: Not on file  Other Topics Concern   Not on file  Social History Narrative   Not on file   Social Drivers of Health   Financial Resource Strain: Not on file  Food Insecurity: Not on file  Transportation Needs:  Not on file  Physical Activity: Not on file  Stress: Not on file  Social Connections: Not on file     Family History: The patient's family history includes Hypertension in her father and mother; Thyroid  disease in her mother.  ROS:   Review of Systems  Constitution: Negative for decreased appetite, fever and weight gain.  HENT: Negative for congestion, ear discharge, hoarse voice and sore throat.   Eyes: Negative for discharge, redness, vision loss in right eye and visual halos.  Cardiovascular: Negative for chest pain, dyspnea on exertion, leg swelling, orthopnea and palpitations.  Respiratory: Negative for cough, hemoptysis, shortness of breath and snoring.   Endocrine: Negative for heat intolerance and polyphagia.  Hematologic/Lymphatic: Negative for bleeding problem. Does not bruise/bleed easily.  Skin: Negative for flushing, nail changes, rash and suspicious lesions.  Musculoskeletal: Negative for arthritis, joint pain, muscle cramps, myalgias, neck pain and stiffness.  Gastrointestinal: Negative for abdominal pain, bowel incontinence, diarrhea and excessive appetite.  Genitourinary: Negative for decreased libido, genital sores and incomplete emptying.  Neurological: Negative for brief paralysis, focal weakness, headaches and loss of balance.  Psychiatric/Behavioral: Negative for altered mental status, depression and suicidal ideas.  Allergic/Immunologic: Negative for HIV exposure and persistent infections.    EKGs/Labs/Other Studies Reviewed:  The following studies were reviewed today:   EKG:  The ekg ordered today demonstrates   Recent Labs: No results found for requested labs within last 365 days.  Recent Lipid Panel No results found for: CHOL, TRIG, HDL, CHOLHDL, VLDL, LDLCALC, LDLDIRECT  Physical Exam:    VS:  BP 110/70 (BP Location: Right Arm, Patient Position: Sitting, Cuff Size: Large)   Pulse 69   Ht 5' 7 (1.702 m)   Wt 215 lb 3.2 oz (97.6  kg)   SpO2 94%   BMI 33.71 kg/m     Wt Readings from Last 3 Encounters:  09/01/24 215 lb 3.2 oz (97.6 kg)  05/19/24 221 lb (100.2 kg)  09/15/23 221 lb 6.4 oz (100.4 kg)     GEN: Well nourished, well developed in no acute distress HEENT: Normal NECK: No JVD; No carotid bruits LYMPHATICS: No lymphadenopathy CARDIAC: S1S2 noted,RRR, no murmurs, rubs, gallops RESPIRATORY:  Clear to auscultation without rales, wheezing or rhonchi  ABDOMEN: Soft, non-tender, non-distended, +bowel sounds, no guarding. EXTREMITIES: No edema, No cyanosis, no clubbing MUSCULOSKELETAL:  No deformity  SKIN: Warm and dry NEUROLOGIC:  Alert and oriented x 3, non-focal PSYCHIATRIC:  Normal affect, good insight  ASSESSMENT:    1. Primary hypertension   2. Palpitations    PLAN:    Right heart enlargement Previous echocardiogram showed mild enlargement without heart failure or elevated pressures. No symptoms warranting immediate testing. - Monitor for symptoms such as significant shortness of breath or swelling. - If symptoms develop, consider repeating echocardiogram.  Palpitations No recent episodes or arrhythmias. Current medications effective. - Monitor for new symptoms such as unexplained palpitations, heart racing, lightheadedness, dizziness, or syncope. - If new symptoms occur, consider repeating heart monitoring.  Essential hypertension Blood pressure well-managed with current medications. - Send medication refills.   The patient is in agreement with the above plan. The patient left the office in stable condition.  The patient will follow up in 1 year or sooner if needed.   Medication Adjustments/Labs and Tests Ordered: Current medicines are reviewed at length with the patient today.  Concerns regarding medicines are outlined above.  Orders Placed This Encounter  Procedures   EKG 12-Lead   No orders of the defined types were placed in this encounter.   Patient Instructions  Medication  Instructions:  Your physician recommends that you continue on your current medications as directed. Please refer to the Current Medication list given to you today.  *If you need a refill on your cardiac medications before your next appointment, please call your pharmacy*  Follow-Up: At [facility], you and your health needs are our priority.  As part of our continuing mission to provide you with exceptional heart care, our providers are all part of one team.  This team includes your primary Cardiologist (physician) and Advanced Practice Providers or APPs (Physician Assistants and Nurse Practitioners) who all work together to provide you with the care you need, when you need it.  Your next appointment:   1 year(s)  Provider:   Magdeline Prange, DO      Adopting a Healthy Lifestyle.  Know what a healthy weight is for you (roughly BMI <25) and aim to maintain this   Aim for 7+ servings of fruits and vegetables daily   65-80+ fluid ounces of water or unsweet tea for healthy kidneys   Limit to max 1 drink of alcohol per day; avoid smoking/tobacco   Limit animal fats in diet for cholesterol and heart  health - choose grass fed whenever available   Avoid highly processed foods, and foods high in saturated/trans fats   Aim for low stress - take time to unwind and care for your mental health   Aim for 150 min of moderate intensity exercise weekly for heart health, and weights twice weekly for bone health   Aim for 7-9 hours of sleep daily   When it comes to diets, agreement about the perfect plan isnt easy to find, even among the experts. Experts at the [facility] [employer] developed an idea known as the Healthy Eating Plate. Just imagine a plate divided into logical, healthy portions.   The emphasis is on diet quality:   Load up on vegetables and fruits - one-half of your plate: Aim for color and variety, and remember that potatoes dont count.   Go for whole grains -  one-quarter of your plate: Whole wheat, barley, wheat berries, quinoa, oats, brown rice, and foods made with them. If you want pasta, go with whole wheat pasta.   Protein power - one-quarter of your plate: Fish, chicken, beans, and nuts are all healthy, versatile protein sources. Limit red meat.   The diet, however, does go beyond the plate, offering a few other suggestions.   Use healthy plant oils, such as olive, canola, soy, corn, sunflower and peanut. Check the labels, and avoid partially hydrogenated oil, which have unhealthy trans fats.   If youre thirsty, drink water. Coffee and tea are good in moderation, but skip sugary drinks and limit milk and dairy products to one or two daily servings.   The type of carbohydrate in the diet is more important than the amount. Some sources of carbohydrates, such as vegetables, fruits, whole grains, and beans-are healthier than others.   Finally, stay active  Signed, Gionni Vaca, DO  09/05/2024 1:21 PM     [facility]

## 2024-09-17 ENCOUNTER — Other Ambulatory Visit: Payer: Self-pay | Admitting: Cardiology

## 2024-09-21 ENCOUNTER — Telehealth: Payer: Self-pay | Admitting: Cardiology

## 2024-09-21 MED ORDER — HYDROCHLOROTHIAZIDE 25 MG PO TABS: 25.0000 mg | ORAL_TABLET | Freq: Every day | ORAL | 3 refills | Status: AC

## 2024-09-21 NOTE — Telephone Encounter (Signed)
 Pt's medication was sent to pt's pharmacy as requested. Confirmation received.

## 2024-09-21 NOTE — Telephone Encounter (Signed)
*  STAT* If patient is at the pharmacy, call can be transferred to refill team.   1. Which medications need to be refilled? (please list name of each medication and dose if known)   hydrochlorothiazide  (HYDRODIURIL ) 25 MG tablet (Expired)   2. Would you like to learn more about the convenience, safety, & potential cost savings by using the [facility]?   3. Are you open to using the [facility] (Type [facility]. ).  4. Which pharmacy/location (including street and city if local pharmacy) is medication to be sent to?  [id number] - [place], [place] - [address]   5. Do they need a 30 day or 90 day supply?   30 day  Patient stated she has 6 tablets left.

## 2024-10-18 ENCOUNTER — Other Ambulatory Visit: Payer: Self-pay | Admitting: Cardiology

## 2024-10-21 ENCOUNTER — Encounter: Payer: Self-pay | Admitting: Cardiology

## 2024-10-22 MED ORDER — DILTIAZEM HCL ER COATED BEADS 180 MG PO CP24: 180.0000 mg | ORAL_CAPSULE | Freq: Every day | ORAL | 3 refills | Status: AC
